# Patient Record
Sex: Female | Born: 1955 | Smoking: Never smoker
Health system: Southern US, Community
[De-identification: ages and names within clinical notes are randomized; demographics above are authoritative.]

## PROBLEM LIST (undated history)

## (undated) DIAGNOSIS — I10 Essential (primary) hypertension: Secondary | ICD-10-CM

## (undated) DIAGNOSIS — E139 Other specified diabetes mellitus without complications: Secondary | ICD-10-CM

## (undated) DIAGNOSIS — E119 Type 2 diabetes mellitus without complications: Secondary | ICD-10-CM

## (undated) HISTORY — DX: Type 2 diabetes mellitus without complications: E11.9

## (undated) HISTORY — DX: Other specified diabetes mellitus without complications: E13.9

## (undated) HISTORY — DX: Essential (primary) hypertension: I10

---

## 2016-09-14 ENCOUNTER — Other Ambulatory Visit: Payer: Self-pay | Admitting: Internal Medicine

## 2016-09-14 DIAGNOSIS — N289 Disorder of kidney and ureter, unspecified: Secondary | ICD-10-CM

## 2016-09-18 ENCOUNTER — Other Ambulatory Visit: Payer: PRIVATE HEALTH INSURANCE

## 2016-09-20 ENCOUNTER — Ambulatory Visit
Admission: RE | Admit: 2016-09-20 | Discharge: 2016-09-20 | Disposition: A | Discharge status: Home / Self Care | Payer: PRIVATE HEALTH INSURANCE | Source: Ambulatory Visit | Attending: Internal Medicine | Admitting: Internal Medicine

## 2016-09-20 DIAGNOSIS — N289 Disorder of kidney and ureter, unspecified: Secondary | ICD-10-CM

## 2016-09-27 ENCOUNTER — Other Ambulatory Visit: Payer: Self-pay | Admitting: Internal Medicine

## 2016-09-27 ENCOUNTER — Ambulatory Visit
Admission: RE | Admit: 2016-09-27 | Discharge: 2016-09-27 | Disposition: A | Discharge status: Home / Self Care | Payer: PRIVATE HEALTH INSURANCE | Source: Ambulatory Visit | Attending: Internal Medicine | Admitting: Internal Medicine

## 2016-09-27 DIAGNOSIS — R05 Cough: Secondary | ICD-10-CM

## 2016-09-27 DIAGNOSIS — R059 Cough, unspecified: Secondary | ICD-10-CM

## 2016-10-16 ENCOUNTER — Encounter: Payer: PRIVATE HEALTH INSURANCE | Attending: Internal Medicine | Admitting: Dietician

## 2016-10-16 DIAGNOSIS — E118 Type 2 diabetes mellitus with unspecified complications: Secondary | ICD-10-CM

## 2016-10-16 DIAGNOSIS — Z713 Dietary counseling and surveillance: Secondary | ICD-10-CM | POA: Diagnosis not present

## 2016-10-16 DIAGNOSIS — E119 Type 2 diabetes mellitus without complications: Secondary | ICD-10-CM | POA: Insufficient documentation

## 2016-10-16 DIAGNOSIS — E1165 Type 2 diabetes mellitus with hyperglycemia: Secondary | ICD-10-CM

## 2016-10-16 DIAGNOSIS — IMO0002 Reserved for concepts with insufficient information to code with codable children: Secondary | ICD-10-CM

## 2016-10-19 ENCOUNTER — Encounter: Payer: Self-pay | Admitting: Dietician

## 2016-10-19 NOTE — Progress Notes (Signed)

## 2016-10-23 ENCOUNTER — Encounter: Payer: PRIVATE HEALTH INSURANCE | Admitting: Dietician

## 2016-10-23 DIAGNOSIS — Z713 Dietary counseling and surveillance: Secondary | ICD-10-CM | POA: Diagnosis not present

## 2016-10-23 DIAGNOSIS — E119 Type 2 diabetes mellitus without complications: Secondary | ICD-10-CM

## 2016-10-23 NOTE — Progress Notes (Signed)

## 2016-10-30 ENCOUNTER — Encounter: Payer: Self-pay | Admitting: Skilled Nursing Facility1

## 2016-10-30 ENCOUNTER — Encounter: Payer: PRIVATE HEALTH INSURANCE | Attending: Internal Medicine | Admitting: Skilled Nursing Facility1

## 2016-10-30 DIAGNOSIS — E119 Type 2 diabetes mellitus without complications: Secondary | ICD-10-CM | POA: Diagnosis not present

## 2016-10-30 DIAGNOSIS — Z713 Dietary counseling and surveillance: Secondary | ICD-10-CM | POA: Insufficient documentation

## 2016-10-30 NOTE — Progress Notes (Signed)
Patient was seen on 10/30/2016 for the third of a series of three diabetes self-management courses at the [facility]. The following learning objectives were met by the patient during this class:  . State the amount of activity recommended for healthy living . Describe activities suitable for individual needs . Identify ways to regularly incorporate activity into daily life . Identify barriers to activity and ways to over come these barriers  Identify diabetes medications being personally used and their primary action for lowering glucose and possible side effects . Describe role of stress on blood glucose and develop strategies to address psychosocial issues . Identify diabetes complications and ways to prevent them  Explain how to manage diabetes during illness . Evaluate success in meeting personal goal . Establish 2-3 goals that they will plan to diligently work on until they return for the  60-monthfollow-up visit  Goals:   I will count my carb choices at most meals and snacks  I will be active 45 minutes or more 7 times a week  Your patient has identified their diabetes self-care support plan as  Family Support Plan:  Attend Monthly Diabetes Support Group as needed or make a future follow up appointment

## 2017-04-05 ENCOUNTER — Other Ambulatory Visit: Payer: Self-pay | Admitting: Obstetrics and Gynecology

## 2017-04-05 ENCOUNTER — Other Ambulatory Visit (HOSPITAL_COMMUNITY)
Admission: RE | Admit: 2017-04-05 | Discharge: 2017-04-05 | Disposition: A | Discharge status: Home / Self Care | Payer: PRIVATE HEALTH INSURANCE | Source: Ambulatory Visit | Attending: Obstetrics and Gynecology | Admitting: Obstetrics and Gynecology

## 2017-04-05 DIAGNOSIS — Z124 Encounter for screening for malignant neoplasm of cervix: Secondary | ICD-10-CM | POA: Diagnosis not present

## 2017-04-10 LAB — CYTOLOGY - PAP
DIAGNOSIS: NEGATIVE
HPV: NOT DETECTED

## 2017-05-07 IMAGING — CR DG CHEST 2V
2 series · 2 of 2 positions shown · non-contrast
Comparison: None.

CLINICAL DATA: Productive cough and chest congestion x 1 weeks, non
smoker, stat reading, pt to return to office w results per order

EXAM:
CHEST  2 VIEW

[w chest pa]
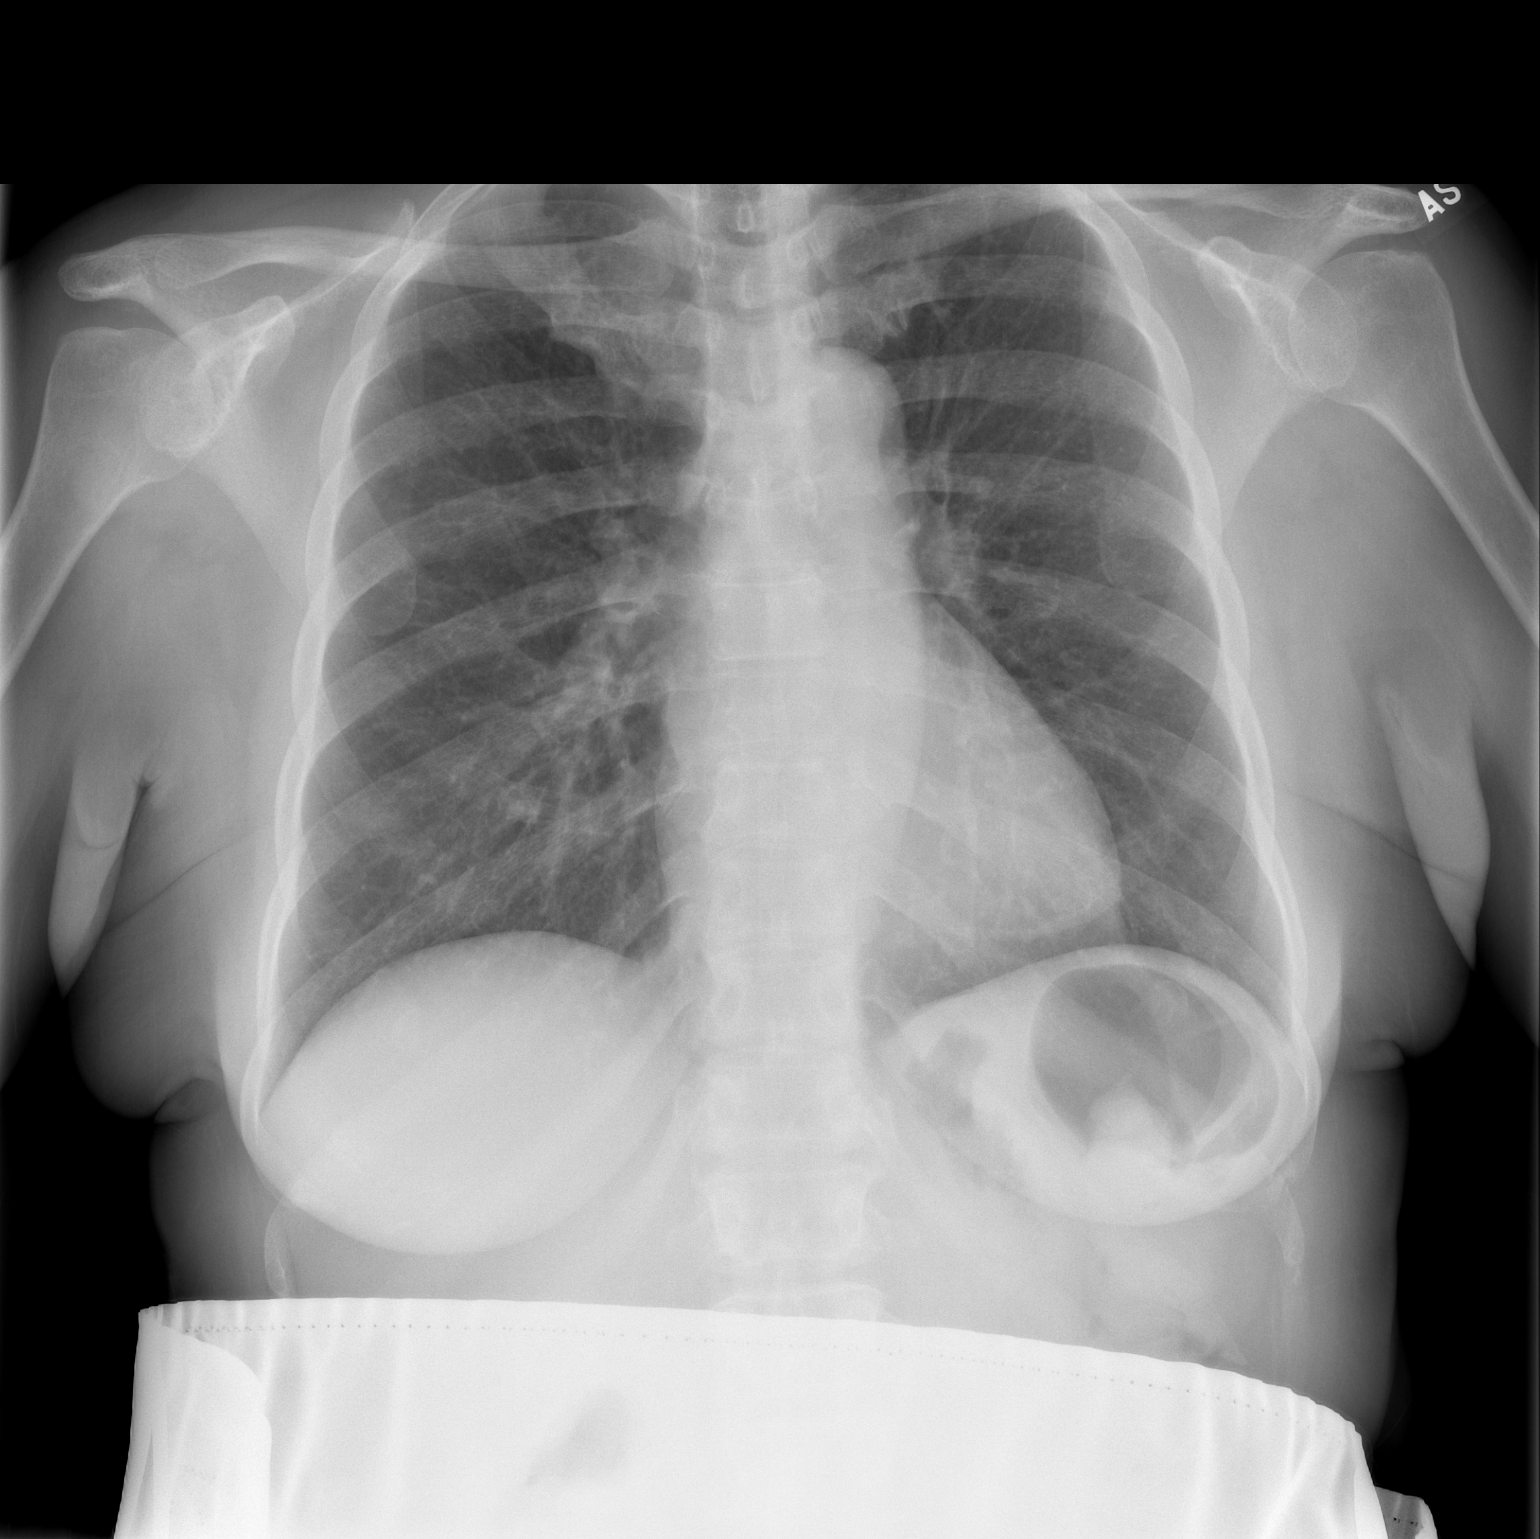

[w chest lat]
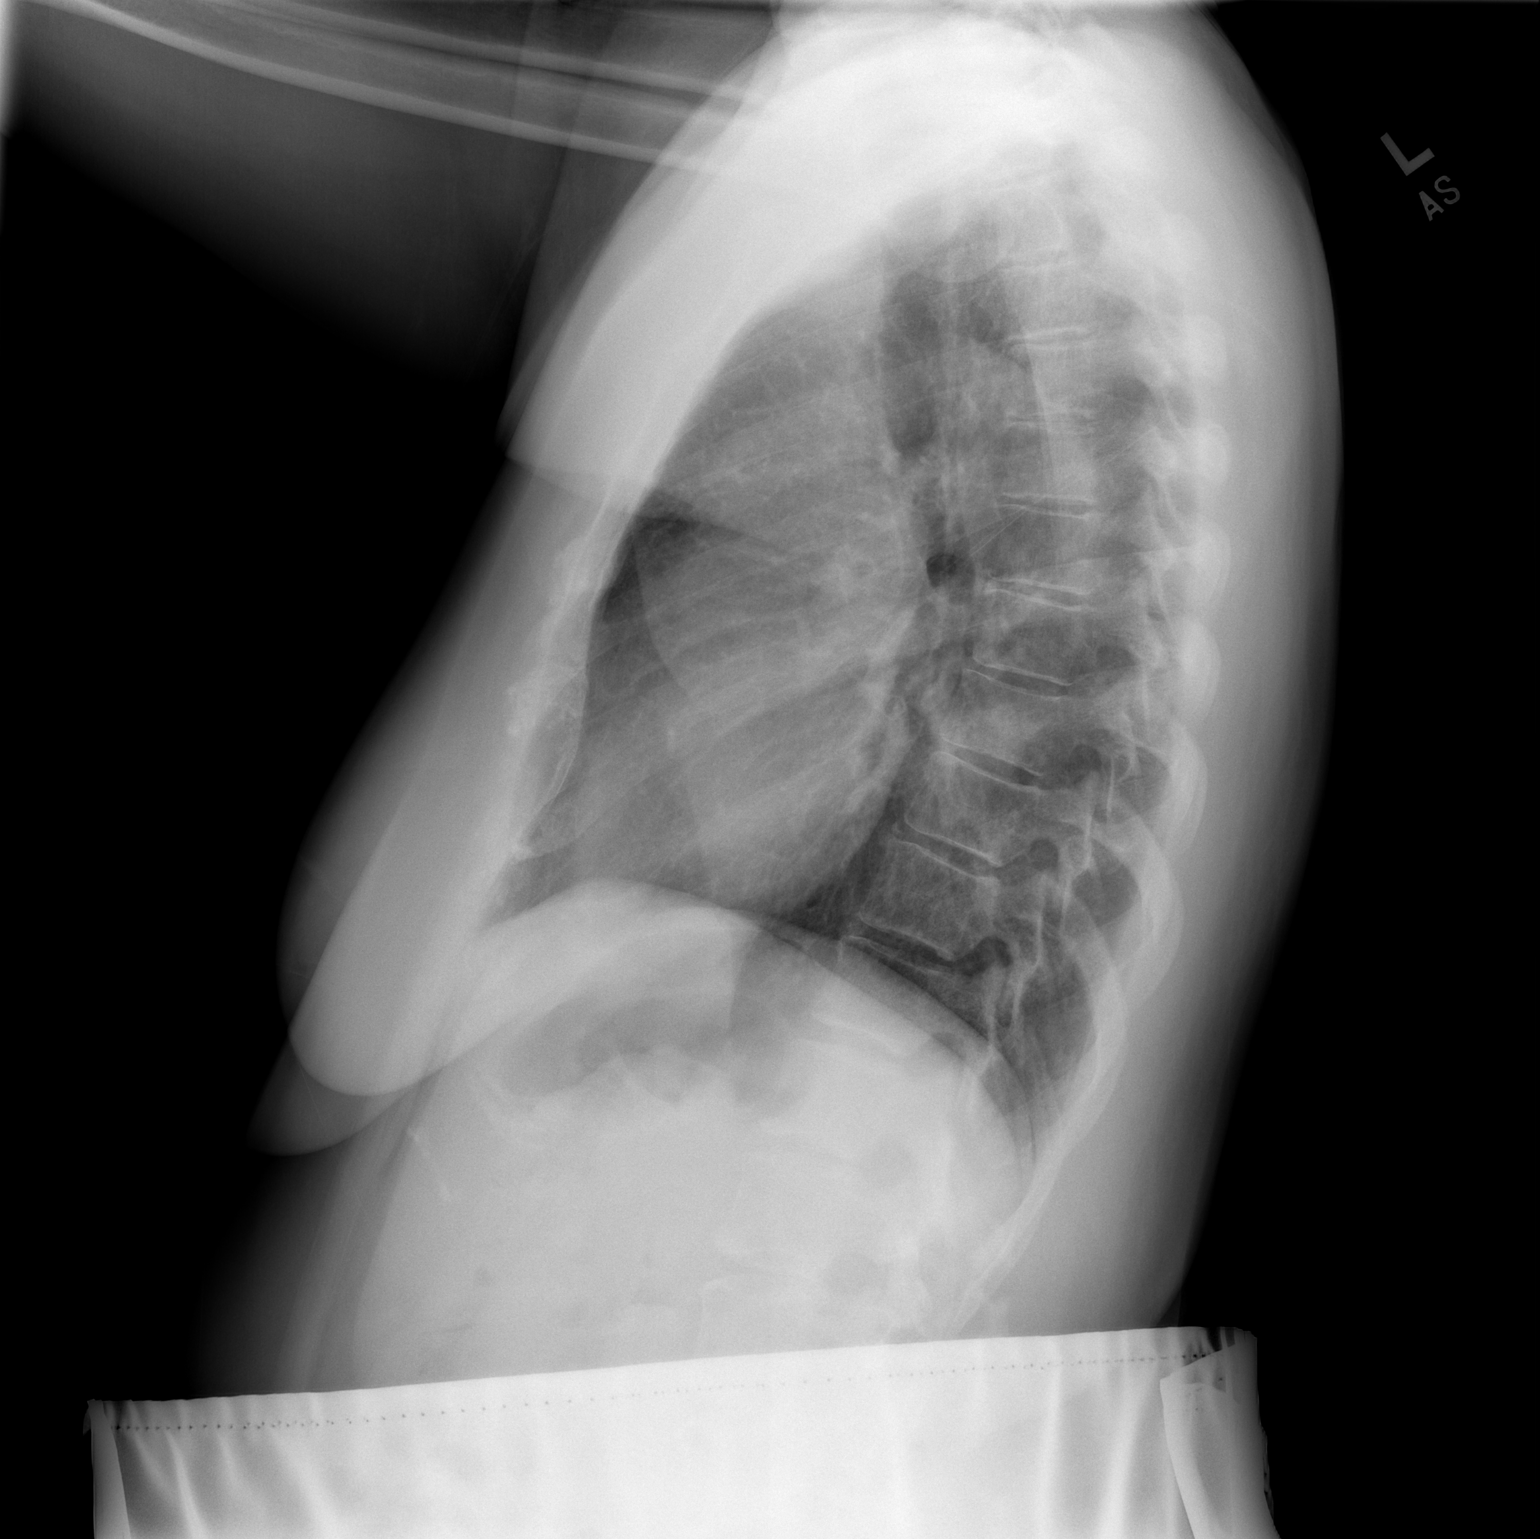

[2 of 2 positions shown; findings below may reference images not displayed]

FINDINGS: The heart size and mediastinal contours are within normal limits.
Both lungs are clear. The visualized skeletal structures are
unremarkable.
IMPRESSION: No active cardiopulmonary disease.

## 2018-04-30 ENCOUNTER — Encounter (INDEPENDENT_AMBULATORY_CARE_PROVIDER_SITE_OTHER): Payer: Self-pay | Admitting: Ophthalmology

## 2018-05-02 NOTE — Progress Notes (Signed)
[facility] - Clinic Note  05/05/2018     CHIEF COMPLAINT Patient presents for Retina Evaluation   HISTORY OF PRESENT ILLNESS: Catherine Boyd is a 62 y.o. female who presents to the clinic today for:   HPI    Retina Evaluation    In both eyes.  This started 1 week ago.  Associated Symptoms Negative for Flashes, Blind Spot, Photophobia, Scalp Tenderness, Fever, Weight Loss, Jaw Claudication, Glare, Pain, Floaters, Distortion, Redness, Trauma, Shoulder/Hip pain and Fatigue.  Context:  distance vision, mid-range vision and near vision.  Treatments tried include no treatments.  I, the attending physician,  performed the HPI with the patient and updated documentation appropriately.          Comments    Referral Of DR. [name] for retina eval. Patient states she has not noticed visual changes ou, she is DM2 x 2 yrs. Pt does not routinely monitor her BS, but states BS are WINL per her PCP, BS controlled by glimepiride.  Denies vit's/gtt's       Last edited by Rennis ChrisZamora, Andilynn Delavega, MD on 05/05/2018  2:04 PM. (History)    Pt states she went to see Dr. Katrinka BlazingSmith for yearly routine exam; Pt states she is taking metformin for DM; Pt states she is taking losartan for HBP; Pt states HBP is "always a little elevated when I got to the doctor"; Pt states she found out she had HBP when she was dx with DM; Pt states she does not know how high BP has ever gotten; Pt denies checking CBG or BP at home;   Referring physician: [name], Keisha, OD [address] [facility] [place], [place] [id number]  HISTORICAL INFORMATION:   Selected notes from the MEDICAL RECORD NUMBER Referred by Dr. Illene LabradorKeisha Smith for DM exam LEE: Kevin Fenton([name]) [BCVA: OD: OS:] Ocular Hx- PMH-DM (taking Metformin), HTN    CURRENT MEDICATIONS: No current outpatient medications on file. (Ophthalmic Drugs)   No current facility-administered medications for this visit.  (Ophthalmic Drugs)   Current Outpatient Medications (Other)   Medication Sig  . atorvastatin (LIPITOR) 10 MG tablet Take 10 mg by mouth daily.  Marland Kitchen. glimepiride (AMARYL) 2 MG tablet Take 2 mg by mouth daily with breakfast.  . lisinopril-hydrochlorothiazide (PRINZIDE,ZESTORETIC) 10-12.5 MG tablet Take 1 tablet by mouth daily.  Marland Kitchen. losartan (COZAAR) 50 MG tablet Take 50 mg by mouth daily.  . metFORMIN (GLUCOPHAGE) 500 MG tablet Take by mouth 2 (two) times daily with a meal.   Current Facility-Administered Medications (Other)  Medication Route  . Bevacizumab (AVASTIN) SOLN 1.25 mg Intravitreal      REVIEW OF SYSTEMS: ROS    Positive for: Endocrine, Eyes   Negative for: Constitutional, Gastrointestinal, Neurological, Skin, Genitourinary, Musculoskeletal, HENT, Cardiovascular, Respiratory, Psychiatric, Allergic/Imm, Heme/Lymph   Last edited by [name], [name], LPN on 2/13/08658/08/2018  1:23 PM. (History)       ALLERGIES Allergies  Allergen Reactions  . Penicillins     PAST MEDICAL HISTORY Past Medical History:  Diagnosis Date  . Diabetes 1.5, managed as type 2 (HCC)   . Diabetes mellitus without complication (HCC)   . Hypertension    History reviewed. No pertinent surgical history.  FAMILY HISTORY Family History  Problem Relation Age of Onset  . Stroke Mother   . Diabetes Paternal Uncle     SOCIAL HISTORY Social History   Tobacco Use  . Smoking status: Never Smoker  . Smokeless tobacco: Never Used  Substance Use Topics  .  Alcohol use: Yes    Alcohol/week: 1.0 standard drinks    Types: 1 Glasses of wine per week    Frequency: Never  . Drug use: Never         OPHTHALMIC EXAM:  Base Eye Exam    Visual Acuity (Snellen - Linear)      Right Left   Dist cc 20/30 20/25   Dist ph cc NI 20/20   Correction:  Glasses       Tonometry (Tonopen, 1:35 PM)      Right Left   Pressure 17 15       Pupils      Dark Light Shape React APD   Right 4 3 Round Brisk None   Left 4 3 Round Brisk None       Visual Fields (Counting fingers)       Left Right    Full Full       Extraocular Movement      Right Left    Ortho Ortho    -- -- --  --  --  -- -- --   -- -- --  --  --  -- -- --         Neuro/Psych    Oriented x3:  Yes   Mood/Affect:  Normal       Dilation    Both eyes:  phenylephrine hydroch @ 1:35 PM        Slit Lamp and Fundus Exam    Slit Lamp Exam      Right Left   Lids/Lashes Dermatochalasis - upper lid Dermatochalasis - upper lid   Conjunctiva/Sclera White and quiet White and quiet   Cornea Arcus, Inferior 2+ Punctate epithelial erosions, irregular epi Arcus, Inferior 2+ Punctate epithelial erosions, irregular epi   Anterior Chamber Deep and quiet Deep and quiet   Iris Round and dilated, No NVI Round and dilated, No NVI   Lens 2+ Nuclear sclerosis, 2-3+ Cortical cataract 2+ Nuclear sclerosis, 2-3+ Cortical cataract   Vitreous Vitreous syneresis Vitreous syneresis, Posterior vitreous detachment       Fundus Exam      Right Left   Disc Edema of superior hemisphere, CWS and hemorrhages along superior rim Pink and Sharp   C/D Ratio 0.5 0.5   Macula dot hemes, blot hemes, edema along ST arcades Good foveal reflex, Retinal pigment epithelial mottling, No heme or edema   Vessels Tortuous, Dilation greatest superiorly, Vascular attenuation, sclerotic arterioles  Mld Vascular attenuation, mildly Tortuous   Periphery Attached, scattered dot and blot hemorrhages superior hemisphere, scattered peripheral drusen Attached, scattered peripheral drusen        Refraction    Wearing Rx      Sphere Cylinder Axis Add   Right +4.25 +0.25 175 +2.50   Left +3.75 Sphere  +2.50       Manifest Refraction      Sphere Cylinder Axis Dist VA   Right +4.75 +0.25 180 20/30   Left +3.25 +0.50 015 20/20-2          IMAGING AND PROCEDURES  Imaging and Procedures for @TODAY @  OCT, Retina - OU - Both Eyes       Right Eye Quality was good. Central Foveal Thickness: 257. Progression has no prior data.  Findings include abnormal foveal contour, intraretinal fluid, no SRF, inner retinal atrophy, intraretinal hyper-reflective material (Inner retinal atrophy superiorly, +IRF superior hemisphere greatest along proximal ST arcade).   Left Eye Quality was good. Central Foveal Thickness: 260.  Progression has no prior data. Findings include normal foveal contour, no IRF, no SRF.   Notes *Images captured and stored on drive  Diagnosis / Impression:  OD: +IRF along sup temp arcades -- greatest proximal arcades; inner-retinal hyperreflectivity and diffuse atrophy           Suspect combined HRVO/HRAO OS: NFP, No IRF/SRF  Clinical management:  See below  Abbreviations: NFP - Normal foveal profile. CME - cystoid macular edema. PED - pigment epithelial detachment. IRF - intraretinal fluid. SRF - subretinal fluid. EZ - ellipsoid zone. ERM - epiretinal membrane. ORA - outer retinal atrophy. ORT - outer retinal tubulation. SRHM - subretinal hyper-reflective material         Fluorescein Angiography Optos (Transit OD)       Right Eye   Progression has no prior data. Early phase findings include vascular perfusion defect, delayed filling (Veinous return not visible until and 36sec;). Mid/Late phase findings include leakage, vascular perfusion defect, staining (Leakage from superior veins ).   Left Eye   Progression has no prior data. Early phase findings include normal observations (Low signal). Mid/Late phase findings include normal observations.   Notes Images captured and stored on drive;   Impression: OD: combined BRAO / BRVO, superior hemisphere OS: normal study          Intravitreal Injection, Pharmacologic Agent - OD - Right Eye       Time Out 05/05/2018. 3:16 PM. Confirmed correct patient, procedure, site, and patient consented.   Anesthesia Topical anesthesia was used. Anesthetic medications included Lidocaine 2%, Tetracaine 0.5%.   Procedure Preparation included 5%  betadine to ocular surface, eyelid speculum. A supplied needle was used.   Injection:  1.25 mg Bevacizumab 1.25mg /0.52ml   NDC: 16109-604-54, Lot: [id number] , Expiration date: 05/28/2018   Route: Intravitreal, Site: Right Eye, Waste: 0 mg  Post-op Post injection exam found visual acuity of at least counting fingers. The patient tolerated the procedure well. There were no complications. The patient received written and verbal post procedure care education.                 ASSESSMENT/PLAN:    ICD-10-CM   1. Branch retinal vein occlusion of right eye with macular edema H34.8310 Fluorescein Angiography Optos (Transit OD)    Intravitreal Injection, Pharmacologic Agent - OD - Right Eye    Bevacizumab (AVASTIN) SOLN 1.25 mg  2. BRAO (branch retinal artery occlusion), right H34.231 Fluorescein Angiography Optos (Transit OD)  3. Retinal edema H35.81 OCT, Retina - OU - Both Eyes  4. Essential hypertension I10   5. Hypertensive retinopathy of both eyes H35.033 Fluorescein Angiography Optos (Transit OD)  6. Diabetes mellitus type 2 without retinopathy (HCC) E11.9   7. Pseudophakia of both eyes Z96.1   8. Dry eyes H04.123     1-3. Combined HRVO/HRAO with macular edema OD-   - The natural history of retinal vein occlusion and macular edema and treatment options including observation, laser photocoagulation, and intravitreal antiVEGF injection with Avastin and Lucentis and Eylea and intravitreal injection of steroids with triamcinolone and Ozurdex and the complications of these procedures including loss of vision, infection, cataract, glaucoma, and retinal detachment were discussed with patient. - Specifically discussed findings from CRUISE / BRAVO study regarding patient stabilization with anti-VEGF agents and increased potential for visual improvements.  Also discussed need for frequent follow up and potentially multiple injections given the chronic nature of the disease process - BCVA 20/30  OD - exam shows sclerotic arterioles, dilated  tortuous venules, scattered IRH and CWS throughout superior hemisphere + intraretinal fluid / edema - OCT shows atrophy with hyperreflective inner retina and CME along sup temp arcades - FA shows severely delayed filling, massive vascular perfusion defects throughout superior hemisphere and late vascular leakage - discussed findings, prognosis and treatment options - recommend IVA OD #1 today, 08.12.19 - RBA of procedure discussed, questions answered - informed consent obtained and signed - see procedure note - recommend vascular work up including carotid dopplers, EKG, echocardiogram, CBC, CMP, possible hypercoag work up -- will send message to PCP, Dr. Nehemiah Settle - F/U 4 weeks -- DFE/OCT/possible injection  4,5. Hypertensive retinopathy OU - discussed importance of tight BP control and likely contribution to above pathologies - monitor  6. Diabetes mellitus, type 2 without retinopathy - The incidence, risk factors for progression, natural history and treatment options for diabetic retinopathy  were discussed with patient.   - The need for close monitoring of blood glucose, blood pressure, and serum lipids, avoiding cigarette or any type of tobacco, and the need for long term follow up was also discussed with patient. - f/u in 1 year, sooner prn  7. 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 - discussed diagnosis and progression - not yet visually significant - monitor for now  8. Dry eyes OU - recommend artificial tears and lubricating ointment as needed    Ophthalmic Meds Ordered this visit:  Meds ordered this encounter  Medications  . Bevacizumab (AVASTIN) SOLN 1.25 mg       Return in about 4 weeks (around 06/02/2018) for F/U HRVO OD, DFE, OCT.  There are no Patient Instructions on file for this visit.   Explained the diagnoses, plan,  and follow up with the patient and they expressed understanding.  Patient expressed understanding of the importance of proper follow up care.   This document serves as a record of services personally performed by Karie Chimera, MD, PhD. It was created on their behalf by Laurian Brim, OA, an ophthalmic assistant. The creation of this record is the provider's dictation and/or activities during the visit.    Electronically signed by: Laurian Brim, OA  08.09.2019 11:53 PM   This document serves as a record of services personally performed by Karie Chimera, MD, PhD. It was created on their behalf by Virgilio Belling, COA, a certified ophthalmic assistant. The creation of this record is the provider's dictation and/or activities during the visit.  Electronically signed by: Virgilio Belling, COA  08.12.19 11:53 PM   Karie Chimera, M.D., Ph.D. Diseases & Surgery of the Retina and Vitreous [facility]  I have reviewed the above documentation for accuracy and completeness, and I agree with the above. Karie Chimera, M.D., Ph.D. 05/05/18 11:56 PM     Abbreviations: M myopia (nearsighted); A astigmatism; H hyperopia (farsighted); P presbyopia; Mrx spectacle prescription;  CTL contact lenses; OD right eye; OS left eye; OU both eyes  XT exotropia; ET esotropia; PEK punctate epithelial keratitis; PEE punctate epithelial erosions; DES dry eye syndrome; MGD meibomian gland dysfunction; ATs artificial tears; PFAT's preservative free artificial tears; NSC nuclear sclerotic cataract; PSC posterior subcapsular cataract; ERM epi-retinal membrane; PVD posterior vitreous detachment; RD retinal detachment; DM diabetes mellitus; DR diabetic retinopathy; NPDR non-proliferative diabetic retinopathy; PDR proliferative diabetic retinopathy; CSME clinically significant macular edema; DME diabetic macular edema; dbh dot blot hemorrhages; CWS cotton wool spot;  POAG primary open angle glaucoma; C/D  cup-to-disc ratio; HVF humphrey visual field; GVF goldmann visual field; OCT optical coherence tomography; IOP intraocular pressure; BRVO Branch retinal vein occlusion; CRVO central retinal vein occlusion; CRAO central retinal artery occlusion; BRAO branch retinal artery occlusion; RT retinal tear; SB scleral buckle; PPV pars plana vitrectomy; VH Vitreous hemorrhage; PRP panretinal laser photocoagulation; IVK intravitreal kenalog; VMT vitreomacular traction; MH Macular hole;  NVD neovascularization of the disc; NVE neovascularization elsewhere; AREDS age related eye disease study; ARMD age related macular degeneration; POAG primary open angle glaucoma; EBMD epithelial/anterior basement membrane dystrophy; ACIOL anterior chamber intraocular lens; IOL intraocular lens; PCIOL posterior chamber intraocular lens; Phaco/IOL phacoemulsification with intraocular lens placement; PRK photorefractive keratectomy; LASIK laser assisted in situ keratomileusis; HTN hypertension; DM diabetes mellitus; COPD chronic obstructive pulmonary disease

## 2018-05-05 ENCOUNTER — Encounter (INDEPENDENT_AMBULATORY_CARE_PROVIDER_SITE_OTHER): Payer: Self-pay | Admitting: Ophthalmology

## 2018-05-05 ENCOUNTER — Ambulatory Visit (INDEPENDENT_AMBULATORY_CARE_PROVIDER_SITE_OTHER): Payer: BC Managed Care – PPO | Admitting: Ophthalmology

## 2018-05-05 DIAGNOSIS — H34231 Retinal artery branch occlusion, right eye: Secondary | ICD-10-CM | POA: Diagnosis not present

## 2018-05-05 DIAGNOSIS — H3581 Retinal edema: Secondary | ICD-10-CM

## 2018-05-05 DIAGNOSIS — I10 Essential (primary) hypertension: Secondary | ICD-10-CM | POA: Diagnosis not present

## 2018-05-05 DIAGNOSIS — H35033 Hypertensive retinopathy, bilateral: Secondary | ICD-10-CM

## 2018-05-05 DIAGNOSIS — E119 Type 2 diabetes mellitus without complications: Secondary | ICD-10-CM

## 2018-05-05 DIAGNOSIS — H04123 Dry eye syndrome of bilateral lacrimal glands: Secondary | ICD-10-CM

## 2018-05-05 DIAGNOSIS — H34831 Tributary (branch) retinal vein occlusion, right eye, with macular edema: Secondary | ICD-10-CM

## 2018-05-05 DIAGNOSIS — H25813 Combined forms of age-related cataract, bilateral: Secondary | ICD-10-CM

## 2018-05-05 MED ORDER — BEVACIZUMAB CHEMO INJECTION 1.25MG/0.05ML SYRINGE FOR KALEIDOSCOPE
1.2500 mg | INTRAVITREAL | Status: DC
  Administered 2018-05-05: 1.25 mg via INTRAVITREAL

## 2018-06-02 NOTE — Progress Notes (Signed)
[facility] - Clinic Note  06/03/2018     CHIEF COMPLAINT Patient presents for Retina Follow Up   HISTORY OF PRESENT ILLNESS: Catherine Boyd is a 62 y.o. female who presents to the clinic today for:   HPI    Retina Follow Up    Patient presents with  Other.  In right eye.  Severity is mild.  Duration of 1 month.  Since onset it is stable.  I, the attending physician,  performed the HPI with the patient and updated documentation appropriately.          Comments    F/U HRVO OD. Patient states she has" become more aware of her vision in her right eye", denies visual changes,flashes,floaters and ocular pain. Pt reports she has received new Rx glasses. BS is not monitor daily, denies hypo/hyperglucemic episodes. Pt is ready for tx today if indicted.       Last edited by Rennis Chris, MD on 06/04/2018 11:29 PM. (History)    Pt states she has not gotten a call or been in touch with PCP since being seen last;   Referring physician: Renford Dills, MD [address] [place], [place] [address]  HISTORICAL INFORMATION:   Selected notes from the MEDICAL RECORD NUMBER Referred by Dr. Illene Labrador for DM exam LEE: Kevin Fenton) [BCVA: OD: OS:] Ocular Hx- PMH-DM (taking Metformin), HTN    CURRENT MEDICATIONS: No current outpatient medications on file. (Ophthalmic Drugs)   No current facility-administered medications for this visit.  (Ophthalmic Drugs)   Current Outpatient Medications (Other)  Medication Sig  . atorvastatin (LIPITOR) 10 MG tablet Take 10 mg by mouth daily.  Marland Kitchen glimepiride (AMARYL) 2 MG tablet Take 2 mg by mouth daily with breakfast.  . lisinopril-hydrochlorothiazide (PRINZIDE,ZESTORETIC) 10-12.5 MG tablet Take 1 tablet by mouth daily.  Marland Kitchen losartan (COZAAR) 50 MG tablet Take 50 mg by mouth daily.  . metFORMIN (GLUCOPHAGE) 500 MG tablet Take by mouth 2 (two) times daily with a meal.   Current Facility-Administered Medications (Other)   Medication Route  . Bevacizumab (AVASTIN) SOLN 1.25 mg Intravitreal  . Bevacizumab (AVASTIN) SOLN 1.25 mg Intravitreal      REVIEW OF SYSTEMS: ROS    Positive for: Eyes   Negative for: Constitutional, Gastrointestinal, Neurological, Skin, Genitourinary, Musculoskeletal, HENT, Endocrine, Cardiovascular, Respiratory, Psychiatric, Allergic/Imm, Heme/Lymph   Last edited by Eldridge Scot, LPN on 02/26/5408 12:43 PM. (History)       ALLERGIES Allergies  Allergen Reactions  . Penicillins     PAST MEDICAL HISTORY Past Medical History:  Diagnosis Date  . Diabetes 1.5, managed as type 2 (HCC)   . Diabetes mellitus without complication (HCC)   . Hypertension    History reviewed. No pertinent surgical history.  FAMILY HISTORY Family History  Problem Relation Age of Onset  . Stroke Mother   . Diabetes Paternal Uncle     SOCIAL HISTORY Social History   Tobacco Use  . Smoking status: Never Smoker  . Smokeless tobacco: Never Used  Substance Use Topics  . Alcohol use: Yes    Alcohol/week: 1.0 standard drinks    Types: 1 Glasses of wine per week    Frequency: Never  . Drug use: Never         OPHTHALMIC EXAM:  Base Eye Exam    Visual Acuity (Snellen - Linear)      Right Left   Dist cc 20/40 20/20   Dist ph cc 20/30 NI  Correction:  Glasses       Tonometry (Tonopen, 1:00 PM)      Right Left   Pressure 15 15       Pupils      Dark Light Shape React APD   Right 4 3 Round Brisk None   Left 4 3 Round Brisk None       Visual Fields (Counting fingers)      Left Right    Full Full       Extraocular Movement      Right Left    Full, Ortho Full, Ortho       Neuro/Psych    Oriented x3:  Yes   Mood/Affect:  Normal       Dilation    Both eyes:  1.0% Mydriacyl, 2.5% Phenylephrine @ 1:00 PM        Slit Lamp and Fundus Exam    Slit Lamp Exam      Right Left   Lids/Lashes Dermatochalasis - upper lid Dermatochalasis - upper lid   Conjunctiva/Sclera  White and quiet White and quiet   Cornea Arcus, Inferior 2+ Punctate epithelial erosions, irregular epi Arcus, Inferior 2+ Punctate epithelial erosions, irregular epi   Anterior Chamber Deep and quiet Deep and quiet   Iris Round and dilated, No NVI Round and dilated, No NVI   Lens 2+ Nuclear sclerosis, 2-3+ Cortical cataract 2+ Nuclear sclerosis, 2-3+ Cortical cataract   Vitreous Vitreous syneresis Vitreous syneresis, Posterior vitreous detachment       Fundus Exam      Right Left   Disc Edema of superior hemisphere, CWS and hemorrhages along superior rim -- all improved Pink and Sharp   C/D Ratio 0.5 0.5   Macula dot hemes, blot hemes, edema along ST arcades, CWS along ST arcades Good foveal reflex, Retinal pigment epithelial mottling, No heme or edema   Vessels Tortuous, Dilation greatest superiorly - less dilated today, Vascular attenuation, sclerotic arterioles with mild improvement Mld Vascular attenuation, mildly Tortuous   Periphery Attached, scattered dot and blot hemorrhages superior hemisphere, scattered peripheral drusen Attached, scattered peripheral drusen          IMAGING AND PROCEDURES  Imaging and Procedures for @TODAY @  OCT, Retina - OU - Both Eyes       Right Eye Quality was good. Central Foveal Thickness: 245. Progression has improved. Findings include abnormal foveal contour, no SRF, inner retinal atrophy, intraretinal hyper-reflective material, no IRF (Inner retinal atrophy superiorly -- stable, interval improvement in IRF superior hemisphere greatest along proximal ST arcade).   Left Eye Quality was good. Central Foveal Thickness: 262. Progression has been stable. Findings include normal foveal contour, no IRF, no SRF.   Notes *Images captured and stored on drive  Diagnosis / Impression:  OD: interval improvement in IRF along sup temp arcades; inner-retinal hyperreflectivity deceased and diffuse atrophy -combined HRVO/HRAO improving OS: NFP, No  IRF/SRF  Clinical management:  See below  Abbreviations: NFP - Normal foveal profile. CME - cystoid macular edema. PED - pigment epithelial detachment. IRF - intraretinal fluid. SRF - subretinal fluid. EZ - ellipsoid zone. ERM - epiretinal membrane. ORA - outer retinal atrophy. ORT - outer retinal tubulation. SRHM - subretinal hyper-reflective material         Intravitreal Injection, Pharmacologic Agent - OD - Right Eye       Time Out 06/03/2018. 1:49 PM. Confirmed correct patient, procedure, site, and patient consented.   Anesthesia Topical anesthesia was used. Anesthetic medications included Lidocaine 2%,  Tetracaine 0.5%.   Procedure Preparation included 5% betadine to ocular surface, eyelid speculum. A 30 gauge needle was used.   Injection:  1.25 mg Bevacizumab 1.25mg /0.53ml   NDC: 67209-470-96, Lot: [id number] , Expiration date: 07/16/2018   Route: Intravitreal, Site: Right Eye, Waste: 0 mL  Post-op Post injection exam found visual acuity of at least counting fingers. The patient tolerated the procedure well. There were no complications. The patient received written and verbal post procedure care education.                 ASSESSMENT/PLAN:    ICD-10-CM   1. Branch retinal vein occlusion of right eye with macular edema H34.8310 OCT, Retina - OU - Both Eyes    Intravitreal Injection, Pharmacologic Agent - OD - Right Eye    Bevacizumab (AVASTIN) SOLN 1.25 mg  2. BRAO (branch retinal artery occlusion), right H34.231   3. Retinal edema H35.81 OCT, Retina - OU - Both Eyes  4. Essential hypertension I10   5. Hypertensive retinopathy of both eyes H35.033   6. Diabetes mellitus type 2 without retinopathy (HCC) E11.9   7. 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 Dry eyes H04.123     1-3. Combined HRVO/HRAO with macular edema OD-  - S/P IVA OD #1 (08.12.19) - BCVA remains 20/30 OD - exam shows sclerotic arterioles, dilated tortuous venules,  scattered IRH and CWS throughout superior hemisphere + intraretinal fluid / edema - OCT shows atrophy with improved hyperreflective inner retina and CME along sup temp arcades - initial FA 8.12.19 showed severely delayed filling, massive vascular perfusion defects throughout superior hemisphere and late vascular leakage - recommend IVA OD #2 today, (09.10.19) - RBA of procedure discussed, questions answered - informed consent obtained and signed - see procedure note - recommend vascular work up including carotid dopplers, EKG, echocardiogram, CBC, CMP, possible hypercoag work up -- will re-send message to PCP, Dr. 11.10.19 - F/U 4 weeks -- DFE/OCT/possible injection  4,5. Hypertensive retinopathy OU - discussed importance of tight BP control and likely contribution to above pathologies - monitor  6. Diabetes mellitus, type 2 without retinopathy - The incidence, risk factors for progression, natural history and treatment options for diabetic retinopathy  were discussed with patient.   - The need for close monitoring of blood glucose, blood pressure, and serum lipids, avoiding cigarette or any type of tobacco, and the need for long term follow up was also discussed with patient. - f/u in 1 year, sooner prn  7. 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 - discussed diagnosis and progression - not yet visually significant - monitor for now  8. Dry eyes OU - recommend artificial tears and lubricating ointment as needed    Ophthalmic Meds Ordered this visit:  Meds ordered this encounter  Medications  . Bevacizumab (AVASTIN) SOLN 1.25 mg       Return in about 4 weeks (around 07/01/2018) for f/u sup HRVO/HRAO - Dilated Exam, OCT, Possible Injxn.  There are no Patient Instructions on file for this visit.   Explained the diagnoses, plan, and follow up with the patient and they expressed  understanding.  Patient expressed understanding of the importance of proper follow up care.   This document serves as a record of services personally performed by 08/31/2018, MD, PhD. It was created on their behalf by Karie Chimera, OA, an ophthalmic assistant. The creation of  this record is the provider's dictation and/or activities during the visit.    Electronically signed by: Laurian Brim, OA  09.09.19 11:35 PM   This document serves as a record of services personally performed by Karie Chimera, MD, PhD. It was created on their behalf by Virgilio Belling, COA, a certified ophthalmic assistant. The creation of this record is the provider's dictation and/or activities during the visit.  Electronically signed by: Virgilio Belling, COA  09.10.19 11:35 PM   Karie Chimera, M.D., Ph.D. Diseases & Surgery of the Retina and Vitreous [facility]   I have reviewed the above documentation for accuracy and completeness, and I agree with the above. Karie Chimera, M.D., Ph.D. 06/04/18 11:35 PM     Abbreviations: M myopia (nearsighted); A astigmatism; H hyperopia (farsighted); P presbyopia; Mrx spectacle prescription;  CTL contact lenses; OD right eye; OS left eye; OU both eyes  XT exotropia; ET esotropia; PEK punctate epithelial keratitis; PEE punctate epithelial erosions; DES dry eye syndrome; MGD meibomian gland dysfunction; ATs artificial tears; PFAT's preservative free artificial tears; NSC nuclear sclerotic cataract; PSC posterior subcapsular cataract; ERM epi-retinal membrane; PVD posterior vitreous detachment; RD retinal detachment; DM diabetes mellitus; DR diabetic retinopathy; NPDR non-proliferative diabetic retinopathy; PDR proliferative diabetic retinopathy; CSME clinically significant macular edema; DME diabetic macular edema; dbh dot blot hemorrhages; CWS cotton wool spot; POAG primary open angle glaucoma; C/D cup-to-disc ratio; HVF humphrey visual field; GVF  goldmann visual field; OCT optical coherence tomography; IOP intraocular pressure; BRVO Branch retinal vein occlusion; CRVO central retinal vein occlusion; CRAO central retinal artery occlusion; BRAO branch retinal artery occlusion; RT retinal tear; SB scleral buckle; PPV pars plana vitrectomy; VH Vitreous hemorrhage; PRP panretinal laser photocoagulation; IVK intravitreal kenalog; VMT vitreomacular traction; MH Macular hole;  NVD neovascularization of the disc; NVE neovascularization elsewhere; AREDS age related eye disease study; ARMD age related macular degeneration; POAG primary open angle glaucoma; EBMD epithelial/anterior basement membrane dystrophy; ACIOL anterior chamber intraocular lens; IOL intraocular lens; PCIOL posterior chamber intraocular lens; Phaco/IOL phacoemulsification with intraocular lens placement; PRK photorefractive keratectomy; LASIK laser assisted in situ keratomileusis; HTN hypertension; DM diabetes mellitus; COPD chronic obstructive pulmonary disease

## 2018-06-03 ENCOUNTER — Ambulatory Visit (INDEPENDENT_AMBULATORY_CARE_PROVIDER_SITE_OTHER): Payer: BC Managed Care – PPO | Admitting: Ophthalmology

## 2018-06-03 ENCOUNTER — Encounter (INDEPENDENT_AMBULATORY_CARE_PROVIDER_SITE_OTHER): Payer: Self-pay | Admitting: Ophthalmology

## 2018-06-03 DIAGNOSIS — H3581 Retinal edema: Secondary | ICD-10-CM

## 2018-06-03 DIAGNOSIS — H04123 Dry eye syndrome of bilateral lacrimal glands: Secondary | ICD-10-CM

## 2018-06-03 DIAGNOSIS — E119 Type 2 diabetes mellitus without complications: Secondary | ICD-10-CM

## 2018-06-03 DIAGNOSIS — H34831 Tributary (branch) retinal vein occlusion, right eye, with macular edema: Secondary | ICD-10-CM

## 2018-06-03 DIAGNOSIS — H25813 Combined forms of age-related cataract, bilateral: Secondary | ICD-10-CM

## 2018-06-03 DIAGNOSIS — H35033 Hypertensive retinopathy, bilateral: Secondary | ICD-10-CM

## 2018-06-03 DIAGNOSIS — I10 Essential (primary) hypertension: Secondary | ICD-10-CM

## 2018-06-03 DIAGNOSIS — H34231 Retinal artery branch occlusion, right eye: Secondary | ICD-10-CM

## 2018-06-04 DIAGNOSIS — H34831 Tributary (branch) retinal vein occlusion, right eye, with macular edema: Secondary | ICD-10-CM | POA: Diagnosis not present

## 2018-06-04 MED ORDER — BEVACIZUMAB CHEMO INJECTION 1.25MG/0.05ML SYRINGE FOR KALEIDOSCOPE
1.2500 mg | INTRAVITREAL | Status: DC
  Administered 2018-06-04: 1.25 mg via INTRAVITREAL

## 2018-07-07 NOTE — Progress Notes (Signed)
[facility] - Clinic Note  07/08/2018     CHIEF COMPLAINT Patient presents for Retina Follow Up   HISTORY OF PRESENT ILLNESS: Catherine Boyd is a 62 y.o. female who presents to the clinic today for:   HPI    Retina Follow Up    Patient presents with  CRVO/BRVO.  In right eye.  This started 1 month ago.  Severity is mild.  Since onset it is stable.  I, the attending physician,  performed the HPI with the patient and updated documentation appropriately.          Comments    F/U CRVO OD. Patient states her vision is still"about the same" denies visual onsets. Bs was checked last month ago ,pt reports is was [facility], .  Denies gtt's . Pt is ready for [place] if indicted.        Last edited by Rennis Chris, MD on 07/08/2018  3:41 PM. (History)      Referring physician: Renford Dills, MD [address] [place], [place] [address]  HISTORICAL INFORMATION:   Selected notes from the MEDICAL RECORD NUMBER Referred by Dr. Illene Labrador for DM exam LEE: Kevin Fenton) [BCVA: OD: OS:] Ocular Hx- PMH-DM (taking Metformin), HTN    CURRENT MEDICATIONS: No current outpatient medications on file. (Ophthalmic Drugs)   No current facility-administered medications for this visit.  (Ophthalmic Drugs)   Current Outpatient Medications (Other)  Medication Sig  . atorvastatin (LIPITOR) 10 MG tablet Take 10 mg by mouth daily.  Marland Kitchen glimepiride (AMARYL) 2 MG tablet Take 2 mg by mouth daily with breakfast.  . lisinopril-hydrochlorothiazide (PRINZIDE,ZESTORETIC) 10-12.5 MG tablet Take 1 tablet by mouth daily.  Marland Kitchen losartan (COZAAR) 50 MG tablet Take 50 mg by mouth daily.  . metFORMIN (GLUCOPHAGE) 500 MG tablet Take by mouth 2 (two) times daily with a meal.   Current Facility-Administered Medications (Other)  Medication Route  . Bevacizumab (AVASTIN) SOLN 1.25 mg Intravitreal  . Bevacizumab (AVASTIN) SOLN 1.25 mg Intravitreal  . Bevacizumab (AVASTIN) SOLN 1.25 mg  Intravitreal      REVIEW OF SYSTEMS: ROS    Positive for: Endocrine, Eyes   Negative for: Constitutional, Gastrointestinal, Neurological, Skin, Genitourinary, Musculoskeletal, HENT, Cardiovascular, Respiratory, Psychiatric, Allergic/Imm, Heme/Lymph   Last edited by Eldridge Scot, LPN on 60/45/4098  2:59 PM. (History)       ALLERGIES Allergies  Allergen Reactions  . Penicillins     PAST MEDICAL HISTORY Past Medical History:  Diagnosis Date  . Diabetes 1.5, managed as type 2 (HCC)   . Diabetes mellitus without complication (HCC)   . Hypertension    History reviewed. No pertinent surgical history.  FAMILY HISTORY Family History  Problem Relation Age of Onset  . Stroke Mother   . Diabetes Paternal Uncle     SOCIAL HISTORY Social History   Tobacco Use  . Smoking status: Never Smoker  . Smokeless tobacco: Never Used  Substance Use Topics  . Alcohol use: Yes    Alcohol/week: 1.0 standard drinks    Types: 1 Glasses of wine per week    Frequency: Never  . Drug use: Never         OPHTHALMIC EXAM:  Base Eye Exam    Visual Acuity (Snellen - Linear)      Right Left   Dist cc 20/40 20/20   Dist ph cc 20/30 NI   Correction:  Glasses       Tonometry (Tonopen, 2:27 PM)  Right Left   Pressure 17 14       Pupils      Dark Light Shape React APD   Right 3 2 Round Brisk None   Left 3 2 Round Brisk None       Visual Fields (Counting fingers)      Left Right    Full Full       Extraocular Movement      Right Left    Full, Ortho Full, Ortho       Neuro/Psych    Oriented x3:  Yes   Mood/Affect:  Normal       Dilation    Both eyes:  1.0% Mydriacyl, 2.5% Phenylephrine @ 2:26 PM        Slit Lamp and Fundus Exam    Slit Lamp Exam      Right Left   Lids/Lashes Dermatochalasis - upper lid Dermatochalasis - upper lid   Conjunctiva/Sclera White and quiet White and quiet   Cornea Arcus, Inferior 2+ Punctate epithelial erosions, irregular epi  Arcus, Inferior 2+ Punctate epithelial erosions, irregular epi   Anterior Chamber Deep and quiet Deep and quiet   Iris Round and dilated, No NVI Round and dilated, No NVI   Lens 2+ Nuclear sclerosis, 2-3+ Cortical cataract 2+ Nuclear sclerosis, 2-3+ Cortical cataract   Vitreous Vitreous syneresis Vitreous syneresis, Posterior vitreous detachment       Fundus Exam      Right Left   Disc Edema of superior hemisphere, CWS and hemorrhages along superior rim -- all improved, promintent cilioretinal arteries, mildly Tilted disc Pink and Sharp, mildly Tilted disc   C/D Ratio 0.5 0.5   Macula dot hemes, blot hemes, edema along ST arcades, CWS along ST arcades, scattered CWS superior macula Blunted foveal reflex, Retinal pigment epithelial mottling, No heme or edema   Vessels Tortuous, Dilation greatest superiorly - less dilated today, Vascular attenuation, sclerotic arterioles with mild improvement, +boxcarring, tortous venules with sheathing, combined HRVO/HRAO, severely sclerotic arterioles superior hemisphere Mld Vascular attenuation, Tortuous   Periphery Attached, scattered dot and blot hemorrhages superior hemisphere, scattered peripheral drusen Attached, scattered peripheral drusen          IMAGING AND PROCEDURES  Imaging and Procedures for @TODAY @  OCT, Retina - OU - Both Eyes       Right Eye Quality was good. Central Foveal Thickness: 233. Progression has been stable. Findings include abnormal foveal contour, no SRF, inner retinal atrophy, intraretinal hyper-reflective material, no IRF (Inner retinal atrophy superiorly -- stable, interval improvement in IRF superior hemisphere greatest along proximal ST arcade).   Left Eye Quality was good. Central Foveal Thickness: 260. Progression has been stable. Findings include normal foveal contour, no IRF, no SRF.   Notes *Images captured and stored on drive  Diagnosis / Impression:  OD: stable resolution in IRF along sup temp arcades;  inner-retinal hyperreflectivity deceased and diffuse atrophy -combined HRVO/HRAO improving OS: NFP, No IRF/SRF  Clinical management:  See below  Abbreviations: NFP - Normal foveal profile. CME - cystoid macular edema. PED - pigment epithelial detachment. IRF - intraretinal fluid. SRF - subretinal fluid. EZ - ellipsoid zone. ERM - epiretinal membrane. ORA - outer retinal atrophy. ORT - outer retinal tubulation. SRHM - subretinal hyper-reflective material         Intravitreal Injection, Pharmacologic Agent - OD - Right Eye       Time Out 07/08/2018. 3:41 PM. Confirmed correct patient, procedure, site, and patient consented.   Anesthesia Topical anesthesia  was used. Anesthetic medications included Lidocaine 2%, Proparacaine 0.5%.   Procedure Preparation included 5% betadine to ocular surface, eyelid speculum. A 30 gauge needle was used.   Injection:  1.25 mg Bevacizumab 1.25mg /0.47ml   NDC: 50242-060-01, Lot: [id number] , Expiration date: 09/04/2018   Route: Intravitreal, Site: Right Eye, Waste: 0 mL  Post-op Post injection exam found visual acuity of at least counting fingers. The patient tolerated the procedure well. There were no complications. The patient received written and verbal post procedure care education.                 ASSESSMENT/PLAN:    ICD-10-CM   1. Branch retinal vein occlusion of right eye with macular edema H34.8310 OCT, Retina - OU - Both Eyes    Intravitreal Injection, Pharmacologic Agent - OD - Right Eye    Bevacizumab (AVASTIN) SOLN 1.25 mg  2. BRAO (branch retinal artery occlusion), right H34.231 OCT, Retina - OU - Both Eyes  3. Retinal edema H35.81 OCT, Retina - OU - Both Eyes  4. Essential hypertension I10   5. Hypertensive retinopathy of both eyes H35.033   6. Diabetes mellitus type 2 without retinopathy (HCC) E11.9   7. 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 Dry eyes H04.123     1-3. Combined HRVO/HRAO with  macular edema OD-  - S/P IVA OD #1 (08.12.19), #2 (09.10.19) - BCVA remains 20/30 OD - exam shows sclerotic arterioles, dilated tortuous venules, scattered IRH and CWS throughout superior hemisphere + intraretinal fluid / edema - OCT shows atrophy with improved hyperreflective inner retina and CME along sup temp arcades - initial FA 8.12.19 showed severely delayed filling, massive vascular perfusion defects throughout superior hemisphere and late vascular leakage - recommend IVA OD #3 today, (10.15.19) - RBA of procedure discussed, questions answered - informed consent obtained and signed - see procedure note - recommend vascular work up including carotid dopplers, EKG, echocardiogram, CBC, CMP, possible hypercoag work up - F/U 4 weeks -- DFE/OCT/possible injection  4,5. Hypertensive retinopathy OU - discussed importance of tight BP control and likely contribution to above pathologies - monitor  6. Diabetes mellitus, type 2 without retinopathy - The incidence, risk factors for progression, natural history and treatment options for diabetic retinopathy  were discussed with patient.   - The need for close monitoring of blood glucose, blood pressure, and serum lipids, avoiding cigarette or any type of tobacco, and the need for long term follow up was also discussed with patient - f/u in 1 year, sooner prn  7. 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 - discussed diagnosis and progression - not yet visually significant - monitor for now  8. Dry eyes OU -  - recommend artificial tears and lubricating ointment as needed    Ophthalmic Meds Ordered this visit:  Meds ordered this encounter  Medications  . Bevacizumab (AVASTIN) SOLN 1.25 mg       Return in about 4 weeks (around 08/05/2018) for F/U HRVO/HRAO OD, DFE, OCT.  There are no Patient Instructions on file for this  visit.   Explained the diagnoses, plan, and follow up with the patient and they expressed understanding.  Patient expressed understanding of the importance of proper follow up care.   This document serves as a record of services personally performed by Karie Chimera, MD, PhD. It was created on their behalf by Virgilio Belling, COA, a  certified ophthalmic assistant. The creation of this record is the provider's dictation and/or activities during the visit.  Electronically signed by: Virgilio Belling, COA  10.14.19 12:38 PM   Karie Chimera, M.D., Ph.D. Diseases & Surgery of the Retina and Vitreous [facility]   I have reviewed the above documentation for accuracy and completeness, and I agree with the above. Karie Chimera, M.D., Ph.D. 07/09/18 12:38 PM    Abbreviations: M myopia (nearsighted); A astigmatism; H hyperopia (farsighted); P presbyopia; Mrx spectacle prescription;  CTL contact lenses; OD right eye; OS left eye; OU both eyes  XT exotropia; ET esotropia; PEK punctate epithelial keratitis; PEE punctate epithelial erosions; DES dry eye syndrome; MGD meibomian gland dysfunction; ATs artificial tears; PFAT's preservative free artificial tears; NSC nuclear sclerotic cataract; PSC posterior subcapsular cataract; ERM epi-retinal membrane; PVD posterior vitreous detachment; RD retinal detachment; DM diabetes mellitus; DR diabetic retinopathy; NPDR non-proliferative diabetic retinopathy; PDR proliferative diabetic retinopathy; CSME clinically significant macular edema; DME diabetic macular edema; dbh dot blot hemorrhages; CWS cotton wool spot; POAG primary open angle glaucoma; C/D cup-to-disc ratio; HVF humphrey visual field; GVF goldmann visual field; OCT optical coherence tomography; IOP intraocular pressure; BRVO Branch retinal vein occlusion; CRVO central retinal vein occlusion; CRAO central retinal artery occlusion; BRAO branch retinal artery occlusion; RT retinal  tear; SB scleral buckle; PPV pars plana vitrectomy; VH Vitreous hemorrhage; PRP panretinal laser photocoagulation; IVK intravitreal kenalog; VMT vitreomacular traction; MH Macular hole;  NVD neovascularization of the disc; NVE neovascularization elsewhere; AREDS age related eye disease study; ARMD age related macular degeneration; POAG primary open angle glaucoma; EBMD epithelial/anterior basement membrane dystrophy; ACIOL anterior chamber intraocular lens; IOL intraocular lens; PCIOL posterior chamber intraocular lens; Phaco/IOL phacoemulsification with intraocular lens placement; PRK photorefractive keratectomy; LASIK laser assisted in situ keratomileusis; HTN hypertension; DM diabetes mellitus; COPD chronic obstructive pulmonary disease

## 2018-07-08 ENCOUNTER — Encounter (INDEPENDENT_AMBULATORY_CARE_PROVIDER_SITE_OTHER): Payer: Self-pay | Admitting: Ophthalmology

## 2018-07-08 ENCOUNTER — Ambulatory Visit (INDEPENDENT_AMBULATORY_CARE_PROVIDER_SITE_OTHER): Payer: BC Managed Care – PPO | Admitting: Ophthalmology

## 2018-07-08 DIAGNOSIS — I10 Essential (primary) hypertension: Secondary | ICD-10-CM

## 2018-07-08 DIAGNOSIS — H04123 Dry eye syndrome of bilateral lacrimal glands: Secondary | ICD-10-CM

## 2018-07-08 DIAGNOSIS — H25813 Combined forms of age-related cataract, bilateral: Secondary | ICD-10-CM

## 2018-07-08 DIAGNOSIS — E119 Type 2 diabetes mellitus without complications: Secondary | ICD-10-CM

## 2018-07-08 DIAGNOSIS — H34831 Tributary (branch) retinal vein occlusion, right eye, with macular edema: Secondary | ICD-10-CM

## 2018-07-08 DIAGNOSIS — H35033 Hypertensive retinopathy, bilateral: Secondary | ICD-10-CM

## 2018-07-08 DIAGNOSIS — H34231 Retinal artery branch occlusion, right eye: Secondary | ICD-10-CM | POA: Diagnosis not present

## 2018-07-08 DIAGNOSIS — H3581 Retinal edema: Secondary | ICD-10-CM | POA: Diagnosis not present

## 2018-07-09 ENCOUNTER — Encounter (INDEPENDENT_AMBULATORY_CARE_PROVIDER_SITE_OTHER): Payer: Self-pay | Admitting: Ophthalmology

## 2018-07-09 DIAGNOSIS — H34831 Tributary (branch) retinal vein occlusion, right eye, with macular edema: Secondary | ICD-10-CM | POA: Diagnosis not present

## 2018-07-09 MED ORDER — BEVACIZUMAB CHEMO INJECTION 1.25MG/0.05ML SYRINGE FOR KALEIDOSCOPE
1.2500 mg | INTRAVITREAL | Status: DC
  Administered 2018-07-09: 1.25 mg via INTRAVITREAL

## 2018-08-12 ENCOUNTER — Encounter (INDEPENDENT_AMBULATORY_CARE_PROVIDER_SITE_OTHER): Payer: BC Managed Care – PPO | Admitting: Ophthalmology

## 2018-08-18 ENCOUNTER — Encounter: Payer: Self-pay | Admitting: Emergency Medicine

## 2018-08-18 ENCOUNTER — Inpatient Hospital Stay (HOSPITAL_COMMUNITY)
Admission: EM | Admit: 2018-08-18 | Discharge: 2018-08-26 | DRG: 065 | Disposition: A | Discharge status: Skilled Nursing Facility | Payer: BC Managed Care – PPO | Attending: Internal Medicine | Admitting: Internal Medicine

## 2018-08-18 ENCOUNTER — Encounter (HOSPITAL_COMMUNITY): Payer: PRIVATE HEALTH INSURANCE

## 2018-08-18 ENCOUNTER — Other Ambulatory Visit: Payer: Self-pay

## 2018-08-18 ENCOUNTER — Inpatient Hospital Stay (HOSPITAL_COMMUNITY): Payer: BC Managed Care – PPO

## 2018-08-18 ENCOUNTER — Emergency Department (HOSPITAL_COMMUNITY): Payer: BC Managed Care – PPO

## 2018-08-18 DIAGNOSIS — T796XXA Traumatic ischemia of muscle, initial encounter: Secondary | ICD-10-CM

## 2018-08-18 DIAGNOSIS — Z823 Family history of stroke: Secondary | ICD-10-CM | POA: Diagnosis not present

## 2018-08-18 DIAGNOSIS — E119 Type 2 diabetes mellitus without complications: Secondary | ICD-10-CM | POA: Diagnosis not present

## 2018-08-18 DIAGNOSIS — I10 Essential (primary) hypertension: Secondary | ICD-10-CM | POA: Diagnosis not present

## 2018-08-18 DIAGNOSIS — G8194 Hemiplegia, unspecified affecting left nondominant side: Secondary | ICD-10-CM | POA: Diagnosis present

## 2018-08-18 DIAGNOSIS — M6282 Rhabdomyolysis: Secondary | ICD-10-CM | POA: Diagnosis present

## 2018-08-18 DIAGNOSIS — Z23 Encounter for immunization: Secondary | ICD-10-CM | POA: Diagnosis not present

## 2018-08-18 DIAGNOSIS — I639 Cerebral infarction, unspecified: Secondary | ICD-10-CM

## 2018-08-18 DIAGNOSIS — E1165 Type 2 diabetes mellitus with hyperglycemia: Secondary | ICD-10-CM | POA: Diagnosis present

## 2018-08-18 DIAGNOSIS — I6340 Cerebral infarction due to embolism of unspecified cerebral artery: Secondary | ICD-10-CM | POA: Diagnosis present

## 2018-08-18 DIAGNOSIS — Z833 Family history of diabetes mellitus: Secondary | ICD-10-CM | POA: Diagnosis not present

## 2018-08-18 DIAGNOSIS — Y92003 Bedroom of unspecified non-institutional (private) residence as the place of occurrence of the external cause: Secondary | ICD-10-CM | POA: Diagnosis not present

## 2018-08-18 DIAGNOSIS — Z79899 Other long term (current) drug therapy: Secondary | ICD-10-CM

## 2018-08-18 DIAGNOSIS — R29711 NIHSS score 11: Secondary | ICD-10-CM | POA: Diagnosis present

## 2018-08-18 DIAGNOSIS — E139 Other specified diabetes mellitus without complications: Secondary | ICD-10-CM

## 2018-08-18 DIAGNOSIS — N179 Acute kidney failure, unspecified: Secondary | ICD-10-CM

## 2018-08-18 DIAGNOSIS — R531 Weakness: Secondary | ICD-10-CM | POA: Diagnosis present

## 2018-08-18 DIAGNOSIS — D72829 Elevated white blood cell count, unspecified: Secondary | ICD-10-CM

## 2018-08-18 DIAGNOSIS — Z7984 Long term (current) use of oral hypoglycemic drugs: Secondary | ICD-10-CM | POA: Diagnosis not present

## 2018-08-18 DIAGNOSIS — W06XXXA Fall from bed, initial encounter: Secondary | ICD-10-CM | POA: Diagnosis present

## 2018-08-18 DIAGNOSIS — E1122 Type 2 diabetes mellitus with diabetic chronic kidney disease: Secondary | ICD-10-CM | POA: Diagnosis present

## 2018-08-18 DIAGNOSIS — R29810 Facial weakness: Secondary | ICD-10-CM | POA: Diagnosis present

## 2018-08-18 DIAGNOSIS — N183 Chronic kidney disease, stage 3 unspecified: Secondary | ICD-10-CM | POA: Diagnosis present

## 2018-08-18 DIAGNOSIS — E785 Hyperlipidemia, unspecified: Secondary | ICD-10-CM | POA: Diagnosis present

## 2018-08-18 DIAGNOSIS — R4701 Aphasia: Secondary | ICD-10-CM | POA: Diagnosis present

## 2018-08-18 DIAGNOSIS — I129 Hypertensive chronic kidney disease with stage 1 through stage 4 chronic kidney disease, or unspecified chronic kidney disease: Secondary | ICD-10-CM | POA: Diagnosis present

## 2018-08-18 DIAGNOSIS — E1151 Type 2 diabetes mellitus with diabetic peripheral angiopathy without gangrene: Secondary | ICD-10-CM | POA: Diagnosis present

## 2018-08-18 DIAGNOSIS — I6300 Cerebral infarction due to thrombosis of unspecified precerebral artery: Secondary | ICD-10-CM | POA: Diagnosis not present

## 2018-08-18 LAB — ETHANOL

## 2018-08-18 LAB — COMPREHENSIVE METABOLIC PANEL
ALBUMIN: 4 g/dL (ref 3.5–5.0)
ALK PHOS: 89 U/L (ref 38–126)
ALT: 77 U/L — AB (ref 0–44)
AST: 46 U/L — AB (ref 15–41)
Anion gap: 13 (ref 5–15)
BUN: 61 mg/dL — ABNORMAL HIGH (ref 8–23)
CALCIUM: 9.1 mg/dL (ref 8.9–10.3)
CHLORIDE: 103 mmol/L (ref 98–111)
CO2: 25 mmol/L (ref 22–32)
CREATININE: 1.88 mg/dL — AB (ref 0.44–1.00)
GFR calc non Af Amer: 28 mL/min — ABNORMAL LOW (ref >60)
GFR, EST AFRICAN AMERICAN: 32 mL/min — AB (ref >60)
GLUCOSE: 190 mg/dL — AB (ref 70–99)
Potassium: 4.2 mmol/L (ref 3.5–5.1)
SODIUM: 141 mmol/L (ref 135–145)
Total Bilirubin: 1.1 mg/dL (ref 0.3–1.2)
Total Protein: 7.6 g/dL (ref 6.5–8.1)

## 2018-08-18 LAB — I-STAT CHEM 8, ED
BUN: 65 mg/dL — ABNORMAL HIGH (ref 8–23)
Calcium, Ion: 0.95 mmol/L — ABNORMAL LOW (ref 1.15–1.40)
Chloride: 107 mmol/L (ref 98–111)
Creatinine, Ser: 1.7 mg/dL — ABNORMAL HIGH (ref 0.44–1.00)
GLUCOSE: 197 mg/dL — AB (ref 70–99)
HCT: 44 % (ref 36.0–46.0)
HEMOGLOBIN: 15 g/dL (ref 12.0–15.0)
POTASSIUM: 4.6 mmol/L (ref 3.5–5.1)
Sodium: 138 mmol/L (ref 135–145)
TCO2: 24 mmol/L (ref 22–32)

## 2018-08-18 LAB — CBC
HCT: 43.9 % (ref 36.0–46.0)
HEMOGLOBIN: 13.6 g/dL (ref 12.0–15.0)
MCH: 26.2 pg (ref 26.0–34.0)
MCHC: 31 g/dL (ref 30.0–36.0)
MCV: 84.4 fL (ref 80.0–100.0)
Platelets: 276 10*3/uL (ref 150–400)
RBC: 5.2 MIL/uL — ABNORMAL HIGH (ref 3.87–5.11)
RDW: 15.1 % (ref 11.5–15.5)
WBC: 16.1 10*3/uL — ABNORMAL HIGH (ref 4.0–10.5)
nRBC: 0 % (ref 0.0–0.2)

## 2018-08-18 LAB — DIFFERENTIAL
ABS IMMATURE GRANULOCYTES: 0.07 10*3/uL (ref 0.00–0.07)
BASOS ABS: 0 10*3/uL (ref 0.0–0.1)
BASOS PCT: 0 %
Eosinophils Absolute: 0 10*3/uL (ref 0.0–0.5)
Eosinophils Relative: 0 %
IMMATURE GRANULOCYTES: 0 %
LYMPHS PCT: 8 %
Lymphs Abs: 1.3 10*3/uL (ref 0.7–4.0)
Monocytes Absolute: 1.3 10*3/uL — ABNORMAL HIGH (ref 0.1–1.0)
Monocytes Relative: 8 %
NEUTROS ABS: 13.4 10*3/uL — AB (ref 1.7–7.7)
NEUTROS PCT: 84 %

## 2018-08-18 LAB — GLUCOSE, CAPILLARY: Glucose-Capillary: 148 mg/dL — ABNORMAL HIGH (ref 70–99)

## 2018-08-18 LAB — APTT: APTT: 27 s (ref 24–36)

## 2018-08-18 LAB — PROTIME-INR
INR: 1.12
Prothrombin Time: 14.3 seconds (ref 11.4–15.2)

## 2018-08-18 LAB — I-STAT TROPONIN, ED: Troponin i, poc: 0.07 ng/mL (ref 0.00–0.08)

## 2018-08-18 LAB — CK: Total CK: 1154 U/L — ABNORMAL HIGH (ref 38–234)

## 2018-08-18 MED ORDER — SENNOSIDES-DOCUSATE SODIUM 8.6-50 MG PO TABS
1.0000 | ORAL_TABLET | Freq: Every evening | ORAL | Status: DC | PRN
  Administered 2018-08-19 – 2018-08-24 (×2): 1 via ORAL
  Filled 2018-08-18 (×2): qty 1

## 2018-08-18 MED ORDER — INSULIN ASPART 100 UNIT/ML ~~LOC~~ SOLN
0.0000 [IU] | Freq: Three times a day (TID) | SUBCUTANEOUS | Status: DC
  Administered 2018-08-19 (×2): 2 [IU] via SUBCUTANEOUS
  Administered 2018-08-21: 8 [IU] via SUBCUTANEOUS
  Administered 2018-08-21 – 2018-08-22 (×3): 3 [IU] via SUBCUTANEOUS
  Administered 2018-08-22: 2 [IU] via SUBCUTANEOUS
  Administered 2018-08-23: 8 [IU] via SUBCUTANEOUS
  Administered 2018-08-23 (×2): 2 [IU] via SUBCUTANEOUS
  Administered 2018-08-24: 3 [IU] via SUBCUTANEOUS
  Administered 2018-08-24: 2 [IU] via SUBCUTANEOUS
  Administered 2018-08-24: 3 [IU] via SUBCUTANEOUS
  Administered 2018-08-25 (×2): 2 [IU] via SUBCUTANEOUS
  Administered 2018-08-25: 3 [IU] via SUBCUTANEOUS
  Administered 2018-08-26: 2 [IU] via SUBCUTANEOUS

## 2018-08-18 MED ORDER — SODIUM CHLORIDE 0.9 % IV SOLN: INTRAVENOUS | Status: DC

## 2018-08-18 MED ORDER — CLOPIDOGREL BISULFATE 75 MG PO TABS
300.0000 mg | ORAL_TABLET | Freq: Once | ORAL | Status: AC
  Administered 2018-08-18: 300 mg via ORAL
  Filled 2018-08-18: qty 4

## 2018-08-18 MED ORDER — SODIUM CHLORIDE 0.9 % IV SOLN
INTRAVENOUS | Status: DC
  Administered 2018-08-18 – 2018-08-20 (×3): via INTRAVENOUS

## 2018-08-18 MED ORDER — ASPIRIN 325 MG PO TABS
325.0000 mg | ORAL_TABLET | Freq: Every day | ORAL | Status: DC
  Administered 2018-08-18 – 2018-08-26 (×9): 325 mg via ORAL
  Filled 2018-08-18 (×9): qty 1

## 2018-08-18 MED ORDER — WHITE PETROLATUM EX OINT
TOPICAL_OINTMENT | CUTANEOUS | Status: DC | PRN
  Filled 2018-08-18: qty 28.35

## 2018-08-18 MED ORDER — ACETAMINOPHEN 160 MG/5ML PO SOLN: 650.0000 mg | ORAL | Status: DC | PRN

## 2018-08-18 MED ORDER — STROKE: EARLY STAGES OF RECOVERY BOOK
Freq: Once | Status: DC
  Filled 2018-08-18 (×3): qty 1

## 2018-08-18 MED ORDER — ENOXAPARIN SODIUM 30 MG/0.3ML ~~LOC~~ SOLN
30.0000 mg | SUBCUTANEOUS | Status: DC
  Administered 2018-08-19: 30 mg via SUBCUTANEOUS
  Filled 2018-08-18 (×2): qty 0.3

## 2018-08-18 MED ORDER — ACETAMINOPHEN 325 MG PO TABS: 650.0000 mg | ORAL_TABLET | ORAL | Status: DC | PRN

## 2018-08-18 MED ORDER — ATORVASTATIN CALCIUM 80 MG PO TABS
80.0000 mg | ORAL_TABLET | Freq: Every day | ORAL | Status: DC
  Administered 2018-08-19: 80 mg via ORAL
  Filled 2018-08-18: qty 1

## 2018-08-18 MED ORDER — CLOPIDOGREL BISULFATE 75 MG PO TABS
75.0000 mg | ORAL_TABLET | Freq: Every day | ORAL | Status: DC
  Administered 2018-08-19: 75 mg via ORAL
  Filled 2018-08-18: qty 1

## 2018-08-18 MED ORDER — ACETAMINOPHEN 650 MG RE SUPP: 650.0000 mg | RECTAL | Status: DC | PRN

## 2018-08-18 MED ORDER — INSULIN ASPART 100 UNIT/ML ~~LOC~~ SOLN: 0.0000 [IU] | Freq: Every day | SUBCUTANEOUS | Status: DC

## 2018-08-18 NOTE — ED Notes (Signed)
Patient is resting comfortably. 

## 2018-08-18 NOTE — ED Notes (Signed)
PT determined to be VAN appropriate- team at beside attempting to get IV 18G started with US.

## 2018-08-18 NOTE — ED Notes (Signed)
MD to update

## 2018-08-18 NOTE — ED Notes (Signed)
Service response called, hospital bed ordered per RN

## 2018-08-18 NOTE — ED Notes (Signed)
Patient transported to MRI 

## 2018-08-18 NOTE — ED Notes (Signed)
No urine collected yet by purewick, verified placement and performed bladder scan (174cc)

## 2018-08-18 NOTE — ED Notes (Signed)
Called 3W to give report.  Receiving RN unable to take report at this time. 

## 2018-08-18 NOTE — H&P (Addendum)
History and Physical    [name]:[id number] DOB: Dec 06, 1955 DOA: 08/18/2018  PCP: Renford Dills, MD Consultants:  Neuro Patient coming from: home- lives alone Chief Complaint: Found down  HPI: Catherine Boyd is a 62 y.o. female with medical history significant for hypertension, diabetes not on insulin, who was brought to the ED today by EMS after being found down at home.  She states that she went to bed on Thursday night and felt normal, woke up in the morning on Friday, went to use the restroom and when she came back to get in bed she states that she was unable to move and fell to the floor.  She denies any headache, lightheadedness, confusion or pain preceding this.  She cannot say why she was unable to get off the floor.  She states that she did not feel weak but she just felt "struck down" and unable to get up.  She is a professor at [employer] and her colleagues realized she had not come to work and became worried about her and 1 of them found her on the floor of her home this morning.  She was awake and alert.  Patient states that she was alert the whole weekend but unable to move.  Currently she has no complaints.  She takes glipizide for her diabetes and Cozaar for hypertension.  She states that her blood pressure is usually controlled with systolics in the 120s to 130s.  She does not take any aspirin or other blood thinners.  She states that both her father and mother had strokes.  She does not smoke.     ED Course: CT of the head showed an acute nonhemorrhagic infarct suspected involving the left corona radiata/lenticular nucleus.  Also a remote small anterior medial left thalamic infarct.  She was awake and alert and able to communicate though severely dysarthric.  She was somewhat hypertensive at 158/81, heart rate 117, respirations 17, 99% on room air.  She was afebrile.  Review of Systems: As per HPI; otherwise review of systems reviewed and negative.    Ambulatory Status: Normally ambulatory, currently nonambulatory   Past Medical History:  Diagnosis Date  . Diabetes 1.5, managed as type 2 (HCC)   . Diabetes mellitus without complication (HCC)   . Hypertension     History reviewed. No pertinent surgical history.  Social History   Socioeconomic History  . Marital status: Unknown    Spouse name: Not on file  . Number of children: Not on file  . Years of education: Not on file  . Highest education level: Not on file  Occupational History  . Not on file  Social Needs  . Financial resource strain: Not on file  . Food insecurity:    Worry: Not on file    Inability: Not on file  . Transportation needs:    Medical: Not on file    Non-medical: Not on file  Tobacco Use  . Smoking status: Never Smoker  . Smokeless tobacco: Never Used  Substance and Sexual Activity  . Alcohol use: Yes    Alcohol/week: 1.0 standard drinks    Types: 1 Glasses of wine per week    Frequency: Never  . Drug use: Never  . Sexual activity: Not on file  Lifestyle  . Physical activity:    Days per week: Not on file    Minutes per session: Not on file  . Stress: Not on file  Relationships  . Social connections:  Talks on phone: Not on file    Gets together: Not on file    Attends religious service: Not on file    Active member of club or organization: Not on file    Attends meetings of clubs or organizations: Not on file    Relationship status: Not on file  . Intimate partner violence:    Fear of current or ex partner: Not on file    Emotionally abused: Not on file    Physically abused: Not on file    Forced sexual activity: Not on file  Other Topics Concern  . Not on file  Social History Narrative  . Not on file    Allergies  Allergen Reactions  . Penicillins Other (See Comments)    fainted    Family History  Problem Relation Age of Onset  . Stroke Mother   . Diabetes Paternal Uncle     Prior to Admission medications    Medication Sig Start Date End Date Taking? Authorizing Provider  atorvastatin (LIPITOR) 10 MG tablet Take 10 mg by mouth daily.   Yes [provider]  glimepiride (AMARYL) 2 MG tablet Take 2 mg by mouth daily with breakfast.   Yes [provider]  losartan (COZAAR) 50 MG tablet Take 50 mg by mouth daily.   Yes [provider]    Physical Exam: Vitals:   08/18/18 1215 08/18/18 1230 08/18/18 1245 08/18/18 1300  BP: (!) 154/89 (!) 164/88 (!) 177/81 (!) 158/81  Pulse: (!) 113 (!) 117  (!) 117  Resp: 16 18 18 17   Temp:      TempSrc:      SpO2: 98% 100%  99%     . General: Appears calm and comfortable and is in NAD . Eyes:  PERRL, EOMI, normal lids, iris . ENT:  grossly normal hearing, lips & tongue, mmm . Neck:  supple, no lymphadenopathy . Cardiovascular:  nL S1, S2, tachycardic, reg rhythm, no murmur. Marland Kitchen. Respiratory:   CTA bilaterally with no wheezes/rales/rhonchi.  Normal respiratory effort. . Abdomen:  soft, NT, ND, NABS . Back:   grossly normal alignment . Skin:  no rash or lesions seen on limited exam . Musculoskeletal:  grossly normal tone BUE/BLE, good ROM, no bony abnormality or obvious joint deformity . Lower extremities:  No LE edema.  Limited foot exam with no ulcerations.  2+ distal pulses. Marland Kitchen. Psychiatric:  grossly normal mood and affect, speech fluent and appropriate, AOx3 . Neurologic:, Alert, oriented to person place month year and situation.  She is able to give a history but has significant dysarthria and difficulty getting words out.  Right facial droop.  Right upper extremity weakness.    Radiological Exams on Admission: Ct Head Wo Contrast  Result Date: 08/18/2018 CLINICAL DATA:  62 year old female found down.  Initial encounter. EXAM: CT HEAD WITHOUT CONTRAST TECHNIQUE: Contiguous axial images were obtained from the base of the skull through the vertex without intravenous contrast. COMPARISON:  None. FINDINGS: Brain: Acute  nonhemorrhagic infarct suspected involving the left corona radiata/lenticular nucleus. Remote small anteromedial left thalamic infarct. Prominent chronic microvascular changes. No intracranial hemorrhage. Global atrophy. Slight asymmetry lateral ventricles without mass seen causing this appearance. The ventricular size is most likely related to atrophy rather hydrocephalus. Aqueduct is patent. No intracranial mass lesion noted on this unenhanced exam. Vascular: Vascular calcifications. Intracranial vasculature is ectatic with calcified plaque cavernous segment internal carotid arteries and portion of vertebral arteries. Skull: No skull fracture Sinuses/Orbits: No acute orbital abnormality.  Visualized paranasal sinuses are clear. Other: Mastoid air cells and middle ear cavities are clear. IMPRESSION: 1. Acute nonhemorrhagic infarct suspected involving the left corona radiata/lenticular nucleus. 2. Remote small anteromedial left thalamic infarct. 3. Prominent chronic microvascular changes. 4. No intracranial hemorrhage. 5. Global atrophy. These results were called by telephone at the time of interpretation on 08/18/2018 at 12:31 pm to Dr. Azalia Bilis , who verbally acknowledged these results. Electronically Signed   By: Lacy Duverney M.D.   On: 08/18/2018 12:40    EKG: Independently reviewed.  Rate 116 Sinus tachycardia Probable left atrial enlargement Left ventricular hypertrophy Artifact in lead(s) I II III aVR aVL aVF V1 V5 V6   Labs on Admission: I have personally reviewed the available labs and imaging studies at the time of the admission.  Pertinent labs:  Sodium 138 potassium 4.6 chloride 107 CO2 25 glucose 197 BUN 65 creatinine 1.7.  Repeat potassium 4.2, repeat creatinine 1.88, BUN 61.  Calcium 9.1 AST 46 ALT 77 otherwise LFTs within normal limits TnI 0.07 WBC 16.1 hemoglobin 13.6 platelets 276 Ethyl alcohol less than 10 INR 1.12 PTT 27   Assessment/Plan Principal Problem:   CVA  (cerebral vascular accident) (HCC)   Acute CVA: Risk factors include diabetes, hypertension, family history. -Follow-up brain MRI, MRA -Carotid Dopplers ordered -TTE ordered -frequent neuro checks -PT/OT/ST -As it has been 3 days, okay to lower blood pressure slowly with a goal of systolic 140/90 per neuro; hold Cozaar -Hemoglobin A1c, lipid panel -start ASA 325 mg daily -Continue Lipitor, increase to 80 mg daily -Check CK and trend if elevated   AKI on CKD III: Care everywhere shows her last creatinine 1.3 with a BUN of 30.  Likely prerenal from not eating or drinking all weekend.  -We will give normal saline at 125 cc an hour to start -Follow-up renal function panel in the morning -Check CK and trend if elevated. If rhabdo, increase rate of IVF  Hypertension: -per neuro, goal BP 140/90 -holding cozaar anyway for AKI  Diabetes mellitus type 2, uncontrolled.  Last hemoglobin A1c from June 05, 2016 was 11.2. -Hold home glipizide -Sliding scale insulin -Carb controlled diet if passes swallow screen -Check hemoglobin A1c in the morning Hypertension -Blood pressure goal 140/90 -Hold Cozaar for now  Leukocytosis, likely due to hemoconcentration from volume depletion.  No evidence of infection. -IVF as above -Follow-up CBC tomorrow    DVT prophylaxis: lovenox Code Status:  Full - confirmed with patient/family Family Communication: none. Friend at bedside  Disposition Plan: TBD, likely inpatient rehab in 3-4 days Consults called: Neurology  Admission status: Admit - It is my clinical opinion that admission to INPATIENT is reasonable and necessary because of the expectation that this patient will require hospital care that crosses at least 2 midnights to treat this condition based on the medical complexity of the problems presented.  Given the aforementioned information, the predictability of an adverse outcome is felt to be significant.     Elyse Hsu MD [facility]  If note is complete, please contact covering daytime or nighttime physician. [email or web address] Password [email or web address]  08/18/2018, 1:40 PM

## 2018-08-18 NOTE — Consult Note (Addendum)
Neurology Consultation  Reason for Consult: Stroke Referring Physician: M post  CC: Left-sided weakness  History is obtained from: Patient  HPI: Catherine Boyd is a 62 y.o. female with past medical history of hypertension and diabetes.  Patient states that she does not take aspirin.  Patient does take a blood pressure medication states her systolic blood pressures usually in the 120s to 130s.  Patient was last seen normal on Thursday.  Patient went to bed when she awoke she noted that she fell out of her bed because her right leg was not strong enough to hold her up.  Unfortunately she was unable to get to a phone and she laid on the floor for 2 days until she was found this morning.  Currently her blood pressure up but as she notes that she has not been able to get a for medications.  Neurology was called to consult.  ED course: CT of head  LKW: 08/15/2018 unknown exact time. tpa given?: no, out of window Premorbid modified Rankin scale (mRS): 0 NIH stroke scale 11 ICH Score: 0   ROS:  ROS was performed and is negative except as noted in the HPI.   Past Medical History:  Diagnosis Date  . Diabetes 1.5, managed as type 2 (HCC)   . Diabetes mellitus without complication (HCC)   . Hypertension    Family History  Problem Relation Age of Onset  . Stroke Mother   . Diabetes Paternal Uncle    Social History:   reports that she has never smoked. She has never used smokeless tobacco. She reports that she drinks about 1.0 standard drinks of alcohol per week. She reports that she does not use drugs.  Medications  Current Facility-Administered Medications:  .  Bevacizumab (AVASTIN) SOLN 1.25 mg, 1.25 mg, Intravitreal, , Rennis Chris, MD, 1.25 mg at 05/05/18 1625 .  Bevacizumab (AVASTIN) SOLN 1.25 mg, 1.25 mg, Intravitreal, , Rennis Chris, MD, 1.25 mg at 06/04/18 2331 .  Bevacizumab (AVASTIN) SOLN 1.25 mg, 1.25 mg, Intravitreal, , Rennis Chris, MD, 1.25 mg at 07/09/18  0151  Current Outpatient Medications:  .  atorvastatin (LIPITOR) 10 MG tablet, Take 10 mg by mouth daily., Disp: , Rfl:  .  glimepiride (AMARYL) 2 MG tablet, Take 2 mg by mouth daily with breakfast., Disp: , Rfl:  .  losartan (COZAAR) 50 MG tablet, Take 50 mg by mouth daily., Disp: , Rfl:    Exam: Current vital signs: BP (!) 158/81   Pulse (!) 117   Temp 98.4 F (36.9 C) (Oral)   Resp 17   SpO2 99%  Vital signs in last 24 hours: Temp:  [98.4 F (36.9 C)] 98.4 F (36.9 C) (11/25 1014) Pulse Rate:  [113-119] 117 (11/25 1300) Resp:  [15-19] 17 (11/25 1300) BP: (154-177)/(81-108) 158/81 (11/25 1300) SpO2:  [93 %-100 %] 99 % (11/25 1300)  Physical Exam  Constitutional: Appears well-developed and well-nourished.  Psych: Affect appropriate to situation Eyes: No scleral injection HENT: No OP obstrucion Head: Normocephalic.  Cardiovascular: Normal rate and regular rhythm.  Respiratory: Effort normal, non-labored breathing GI: Soft.  No distension. There is no tenderness.  Skin: WDI, bruises noted and on the right aspect of face and right knee  Neuro: Mental Status: Patient is awake, alert, oriented to person, place, month, year, and situation. Patient is able to give a clear and coherent history. No aphasia but she has significant dysarthria Cranial Nerves: II: Visual Fields are full.  III,IV, VI: EOMI however it  is noted that it appears that her left eye has more movement in her right eye.  No noted diploplia. Pupils are equal, round, and reactive to light.  V: Facial sensation is symmetric to temperature VII: Facial movement shows a significant right facial droop VIII: hearing is intact to voice X: Uvula elevates symmetrically XI: Right shoulder does not move as much his left shoulder XII: tongue is midline without atrophy or fasciculations.  Motor: Patient is unable to lift her right arm but she does have 3/5 strength with bicep and 2/5 right tricep she does have a wrist  drop on the right side.  As far as her leg, she only can lift it approximately 1 inch off the bed.  However with hip flexion she does show 4/5 strength and with ankle flexion and plantar flexion she does show 4/5 strength.  Left side has full 5/5 strength Sensory: Decreased sensation on her right arm otherwise normal Deep Tendon Reflexes: No ankle jerk, 2+ knee jerk, 2+ upper extremity reflexes Plantars: Toes are downgoing bilaterally.  Cerebellar: Finger-to-nose and heel-to-shin on the left are normal on the right unable to do  Labs I have reviewed labs in epic and the results pertinent to this consultation are:   CBC    Component Value Date/Time   WBC 16.1 (H) 08/18/2018 1111   RBC 5.20 (H) 08/18/2018 1111   HGB 13.6 08/18/2018 1111   HCT 43.9 08/18/2018 1111   PLT 276 08/18/2018 1111   MCV 84.4 08/18/2018 1111   MCH 26.2 08/18/2018 1111   MCHC 31.0 08/18/2018 1111   RDW 15.1 08/18/2018 1111   LYMPHSABS 1.3 08/18/2018 1111   MONOABS 1.3 (H) 08/18/2018 1111   EOSABS 0.0 08/18/2018 1111   BASOSABS 0.0 08/18/2018 1111    CMP     Component Value Date/Time   NA 141 08/18/2018 1111   K 4.2 08/18/2018 1111   CL 103 08/18/2018 1111   CO2 25 08/18/2018 1111   GLUCOSE 190 (H) 08/18/2018 1111   BUN 61 (H) 08/18/2018 1111   CREATININE 1.88 (H) 08/18/2018 1111   CALCIUM 9.1 08/18/2018 1111   PROT 7.6 08/18/2018 1111   ALBUMIN 4.0 08/18/2018 1111   AST 46 (H) 08/18/2018 1111   ALT 77 (H) 08/18/2018 1111   ALKPHOS 89 08/18/2018 1111   BILITOT 1.1 08/18/2018 1111   GFRNONAA 28 (L) 08/18/2018 1111   GFRAA 32 (L) 08/18/2018 1111    Lipid Panel  No results found for: CHOL, TRIG, HDL, CHOLHDL, VLDL, LDLCALC, LDLDIRECT   Imaging I have reviewed the images obtained:  CT-scan of the brain-acute nonhemorrhagic infarct suspected involving the left corona radiata/lenticular nucleus.  In addition remote small anterior medial left thalamic infarct  MRI examination of the  brain  Felicie Morn PA-C [facility] [phone]  M-F  (9:00 am- 5:00 PM)  08/18/2018, 1:30 PM     Assessment:  62 year old female presenting to the hospital with a subacute left corona radiata infarct.    Recommendations:  # MRI of the brain without contrast #MRA Head and neck  #Transthoracic Echo,  # Start patient on ASA 325mg  daily, added Plavix  #Start or continue Atorvastatin 80 mg/other high intensity statin # BP goal: As it is been 3 days it is okay to lower blood pressure slowly with a goal of systolic 140/90 # HBAIC and Lipid profile # Telemetry monitoring # Frequent neuro checks # NPO until passes stroke swallow screen # please page stroke NP  Or  PA  Or MD from 8am -4 pm  as this patient from this time will be  followed by the stroke.   You can look them up on [email or web address]  Password [email or web address]   NEUROHOSPITALIST ADDENDUM Performed a face to face diagnostic evaluation.   I have reviewed the contents of history and physical exam as documented by PA/ARNP/Resident and agree with above documentation.  I have discussed and formulated the above plan as documented. Edits to the note have been made as needed.   62 year old female with hypertension diabetes presents after having symptoms started Friday patient was been found down due to right leg weakness.  Patient states she laid on the floor for 2 days and she was unable to get up and use a phone.  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  On my assessment patient has no visual neglect, hemiparesis of the right side.  So appeared mildly confused with severe dysarthria. MRI brain shows a left basal ganglia/corona radiata lagune.  MRA was negative for large vessel occlusion.  She also has a subacute infarction on the right pons. Does have small chronic microhemorrhages in SWI sequence, likely from chronic HTN.   We will start patient on aspirin and Plavix for 3 weeks. Will obtain Carotid  doppler and Echo.    Georgiana SpinnerSushanth Ryder Chesmore MD [facility] [phone]   If 7pm to 7am, please call on call as listed on AMION.

## 2018-08-18 NOTE — ED Triage Notes (Signed)
R sided weakness and facial paralysis. Found prone in bedroom by police. 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 Last seen by neighbors Thurs morning taking out trash.

## 2018-08-18 NOTE — ED Provider Notes (Signed)
[facility] EMERGENCY DEPARTMENT Provider Note   CSN: [id number] Arrival date & time: 08/18/18  1002     History   Chief Complaint Chief Complaint  Patient presents with  . Cerebrovascular Accident   Level 5 caveat: Dysarthria  HPI Catherine Boyd is a 62 y.o. female.  HPI Patient is a 62 year old female with a history of diabetes and hypertension and no prior history of stroke who was found on the floor next to her bed today.  It is suspected that she has laid there for the last 3 days.  She was last seen normal on Thursday afternoon.  She reports that she fell Friday morning is been unable to get up with from the bed since then.  She was found laying in urine.  She has contracture of her right upper extremity and weakness of her right upper extremity as well as some mild dysarthric speech.  Complains of mild headache at this time. Past Medical History:  Diagnosis Date  . Diabetes 1.5, managed as type 2 (HCC)   . Diabetes mellitus without complication (HCC)   . Hypertension     Patient Active Problem List   Diagnosis Date Noted  . Chronic kidney disease, stage III (moderate) (HCC) 08/18/2018  . Diabetes mellitus (HCC) 08/18/2018  . Hypertension 08/18/2018  . CVA (cerebral vascular accident) (HCC) 08/18/2018  . AKI (acute kidney injury) (HCC) 08/18/2018    History reviewed. No pertinent surgical history.   OB History   None      Home Medications    Prior to Admission medications   Medication Sig Start Date End Date Taking? Authorizing Provider  atorvastatin (LIPITOR) 10 MG tablet Take 10 mg by mouth daily.   Yes [provider]  glimepiride (AMARYL) 2 MG tablet Take 2 mg by mouth daily with breakfast.   Yes [provider]  losartan (COZAAR) 50 MG tablet Take 50 mg by mouth daily.   Yes [provider]    Family History Family History  Problem Relation Age of Onset  . Stroke Mother   . Stroke Father   .  Hypertension Father   . Diabetes Paternal Uncle     Social History Social History   Tobacco Use  . Smoking status: Never Smoker  . Smokeless tobacco: Never Used  Substance Use Topics  . Alcohol use: Yes    Alcohol/week: 1.0 standard drinks    Types: 1 Glasses of wine per week    Frequency: Never  . Drug use: Never     Allergies   Penicillins   Review of Systems Review of Systems  Unable to perform ROS: Mental status change     Physical Exam Updated Vital Signs BP 128/63   Pulse (!) 109   Temp 98.4 F (36.9 C) (Oral)   Resp (!) 27   SpO2 98%   Physical Exam  Constitutional: She appears well-developed and well-nourished. No distress.  HENT:  Head: Normocephalic and atraumatic.  Eyes: EOM are normal.  Neck: Normal range of motion.  Cardiovascular: Normal rate, regular rhythm and normal heart sounds.  Pulmonary/Chest: Effort normal and breath sounds normal.  Abdominal: Soft. She exhibits no distension. There is no tenderness.  Musculoskeletal: Normal range of motion.  Neurological: She is alert.  Dysarthric speech.  Contracture of the right upper extremity.  Unable to raise the right upper extremity against gravity.  4 out of 5 strength of the right lower extremity.  5 out of 5 strength of the  left upper and left lower extremity  Skin: Skin is warm and dry.  Psychiatric: She has a normal mood and affect. Judgment normal.  Nursing note and vitals reviewed.    ED Treatments / Results  Labs (all labs ordered are listed, but only abnormal results are displayed) Labs Reviewed  CBC - Abnormal; Notable for the following components:      Result Value   WBC 16.1 (*)    RBC 5.20 (*)    All other components within normal limits  DIFFERENTIAL - Abnormal; Notable for the following components:   Neutro Abs 13.4 (*)    Monocytes Absolute 1.3 (*)    All other components within normal limits  COMPREHENSIVE METABOLIC PANEL - Abnormal; Notable for the following  components:   Glucose, Bld 190 (*)    BUN 61 (*)    Creatinine, Ser 1.88 (*)    AST 46 (*)    ALT 77 (*)    GFR calc non Af Amer 28 (*)    GFR calc Af Amer 32 (*)    All other components within normal limits  CK - Abnormal; Notable for the following components:   Total CK 1,154 (*)    All other components within normal limits  I-STAT CHEM 8, ED - Abnormal; Notable for the following components:   BUN 65 (*)    Creatinine, Ser 1.70 (*)    Glucose, Bld 197 (*)    Calcium, Ion 0.95 (*)    All other components within normal limits  ETHANOL  PROTIME-INR  APTT  RAPID URINE DRUG SCREEN, HOSP PERFORMED  URINALYSIS, ROUTINE W REFLEX MICROSCOPIC  HIV ANTIBODY (ROUTINE TESTING W REFLEX)  HEMOGLOBIN A1C  LIPID PANEL  CK  I-STAT TROPONIN, ED    EKG EKG Interpretation  Date/Time:  Monday August 18 2018 10:12:40 EST Ventricular Rate:  116 PR Interval:    QRS Duration: 211 QT Interval:  332 QTC Calculation: 462 R Axis:   -15 Text Interpretation:  Sinus tachycardia Probable left atrial enlargement Left ventricular hypertrophy Artifact in lead(s) I II III aVR aVL aVF V1 V5 V6 No old tracing to compare Confirmed by Azalia Bilisampos, Hadli Vandemark ([id number]) on 08/18/2018 1:04:17 PM   Radiology Ct Head Wo Contrast  Result Date: 08/18/2018 CLINICAL DATA:  62 year old female found down.  Initial encounter. EXAM: CT HEAD WITHOUT CONTRAST TECHNIQUE: Contiguous axial images were obtained from the base of the skull through the vertex without intravenous contrast. COMPARISON:  None. FINDINGS: Brain: Acute nonhemorrhagic infarct suspected involving the left corona radiata/lenticular nucleus. Remote small anteromedial left thalamic infarct. Prominent chronic microvascular changes. No intracranial hemorrhage. Global atrophy. Slight asymmetry lateral ventricles without mass seen causing this appearance. The ventricular size is most likely related to atrophy rather hydrocephalus. Aqueduct is patent. No intracranial mass  lesion noted on this unenhanced exam. Vascular: Vascular calcifications. Intracranial vasculature is ectatic with calcified plaque cavernous segment internal carotid arteries and portion of vertebral arteries. Skull: No skull fracture Sinuses/Orbits: No acute orbital abnormality. Visualized paranasal sinuses are clear. Other: Mastoid air cells and middle ear cavities are clear. IMPRESSION: 1. Acute nonhemorrhagic infarct suspected involving the left corona radiata/lenticular nucleus. 2. Remote small anteromedial left thalamic infarct. 3. Prominent chronic microvascular changes. 4. No intracranial hemorrhage. 5. Global atrophy. These results were called by telephone at the time of interpretation on 08/18/2018 at 12:31 pm to Dr. Azalia BilisKEVIN Lenoria Narine , who verbally acknowledged these results. Electronically Signed   By: Lacy DuverneySteven  Olson M.D.   On: 08/18/2018 12:40  Procedures .Critical Care Performed by: Azalia Bilis, MD Authorized by: Azalia Bilis, MD    CRITICAL CARE Performed by: Azalia Bilis Total critical care time: 31 minutes Critical care time was exclusive of separately billable procedures and treating other patients. Critical care was necessary to treat or prevent imminent or life-threatening deterioration. Critical care was time spent personally by me on the following activities: development of treatment plan with patient and/or surrogate as well as nursing, discussions with consultants, evaluation of patient's response to treatment, examination of patient, obtaining history from patient or surrogate, ordering and performing treatments and interventions, ordering and review of laboratory studies, ordering and review of radiographic studies, pulse oximetry and re-evaluation of patient's condition.   Medications Ordered in ED Medications  atorvastatin (LIPITOR) tablet 80 mg (has no administration in time range)   stroke: mapping our early stages of recovery book (has no administration in time  range)  acetaminophen (TYLENOL) tablet 650 mg (has no administration in time range)    Or  acetaminophen (TYLENOL) solution 650 mg (has no administration in time range)    Or  acetaminophen (TYLENOL) suppository 650 mg (has no administration in time range)  senna-docusate (Senokot-S) tablet 1 tablet (has no administration in time range)  enoxaparin (LOVENOX) injection 30 mg (has no administration in time range)  0.9 %  sodium chloride infusion ( Intravenous New Bag/Given 08/18/18 1529)  insulin aspart (novoLOG) injection 0-15 Units (has no administration in time range)  insulin aspart (novoLOG) injection 0-5 Units (has no administration in time range)  aspirin tablet 325 mg (has no administration in time range)     Initial Impression / Assessment and Plan / ED Course  I have reviewed the triage vital signs and the nursing notes.  Pertinent labs & imaging results that were available during my care of the patient were reviewed by me and considered in my medical decision making (see chart for details).     Acute stroke.  Patient is not a candidate for TPA secondary to downtime.  Acute kidney injury likely secondary to dehydration.  IV fluids given.  CK laboratory values pending at this time.  Case discussed with neurology who will see the patient in consultation.  Tried hospitalist admission.  I discussed the case with the patient's family member who is driving down from [place] at this time.  This was her sister.  All questions answered.  Ongoing close evaluation in the emergency department while awaiting a bed upstairs.  Consultation; [facility] Dr Holli Humbles MD  Final Clinical Impressions(s) / ED Diagnoses   Final diagnoses:  Acute CVA (cerebrovascular accident) [facility])    ED Discharge Orders    None       Azalia Bilis, MD 08/18/18 1715

## 2018-08-19 ENCOUNTER — Inpatient Hospital Stay (HOSPITAL_COMMUNITY): Payer: BC Managed Care – PPO

## 2018-08-19 DIAGNOSIS — I639 Cerebral infarction, unspecified: Secondary | ICD-10-CM

## 2018-08-19 DIAGNOSIS — I6300 Cerebral infarction due to thrombosis of unspecified precerebral artery: Secondary | ICD-10-CM

## 2018-08-19 LAB — COMPREHENSIVE METABOLIC PANEL
ALBUMIN: 3 g/dL — AB (ref 3.5–5.0)
ALT: 49 U/L — ABNORMAL HIGH (ref 0–44)
AST: 35 U/L (ref 15–41)
Alkaline Phosphatase: 63 U/L (ref 38–126)
Anion gap: 9 (ref 5–15)
BUN: 55 mg/dL — AB (ref 8–23)
CHLORIDE: 114 mmol/L — AB (ref 98–111)
CO2: 20 mmol/L — ABNORMAL LOW (ref 22–32)
Calcium: 8.1 mg/dL — ABNORMAL LOW (ref 8.9–10.3)
Creatinine, Ser: 1.69 mg/dL — ABNORMAL HIGH (ref 0.44–1.00)
GFR calc Af Amer: 37 mL/min — ABNORMAL LOW (ref >60)
GFR calc non Af Amer: 32 mL/min — ABNORMAL LOW (ref >60)
GLUCOSE: 110 mg/dL — AB (ref 70–99)
Potassium: 3.8 mmol/L (ref 3.5–5.1)
SODIUM: 143 mmol/L (ref 135–145)
Total Bilirubin: 1.1 mg/dL (ref 0.3–1.2)
Total Protein: 5.8 g/dL — ABNORMAL LOW (ref 6.5–8.1)

## 2018-08-19 LAB — URINALYSIS, ROUTINE W REFLEX MICROSCOPIC
Bacteria, UA: NONE SEEN
Bilirubin Urine: NEGATIVE
Glucose, UA: NEGATIVE mg/dL
Hgb urine dipstick: NEGATIVE
Ketones, ur: 5 mg/dL — AB
Nitrite: NEGATIVE
Protein, ur: NEGATIVE mg/dL
Specific Gravity, Urine: 1.026 (ref 1.005–1.030)
pH: 5 (ref 5.0–8.0)

## 2018-08-19 LAB — CK: Total CK: 894 U/L — ABNORMAL HIGH (ref 38–234)

## 2018-08-19 LAB — LIPID PANEL
Cholesterol: 153 mg/dL (ref 0–200)
HDL: 49 mg/dL (ref >40)
LDL Cholesterol: 75 mg/dL (ref 0–99)
Total CHOL/HDL Ratio: 3.1 RATIO
Triglycerides: 143 mg/dL (ref <150)
VLDL: 29 mg/dL (ref 0–40)

## 2018-08-19 LAB — RAPID URINE DRUG SCREEN, HOSP PERFORMED
Amphetamines: NOT DETECTED
Barbiturates: NOT DETECTED
Benzodiazepines: NOT DETECTED
Cocaine: NOT DETECTED
Opiates: NOT DETECTED
Tetrahydrocannabinol: NOT DETECTED

## 2018-08-19 LAB — HEMOGLOBIN A1C
Hgb A1c MFr Bld: 6.6 % — ABNORMAL HIGH (ref 4.8–5.6)
Mean Plasma Glucose: 142.72 mg/dL

## 2018-08-19 LAB — GLUCOSE, CAPILLARY
Glucose-Capillary: 89 mg/dL (ref 70–99)
Glucose-Capillary: 134 mg/dL — ABNORMAL HIGH (ref 70–99)
Glucose-Capillary: 142 mg/dL — ABNORMAL HIGH (ref 70–99)
Glucose-Capillary: 189 mg/dL — ABNORMAL HIGH (ref 70–99)

## 2018-08-19 LAB — HIV ANTIBODY (ROUTINE TESTING W REFLEX): HIV Screen 4th Generation wRfx: NONREACTIVE

## 2018-08-19 LAB — ECHOCARDIOGRAM COMPLETE
Height: 69 in
Weight: 3015.89 oz

## 2018-08-19 MED ORDER — PNEUMOCOCCAL VAC POLYVALENT 25 MCG/0.5ML IJ INJ
0.5000 mL | INJECTION | INTRAMUSCULAR | Status: AC
  Administered 2018-08-20: 0.5 mL via INTRAMUSCULAR
  Filled 2018-08-19 (×2): qty 0.5

## 2018-08-19 MED ORDER — ATORVASTATIN CALCIUM 40 MG PO TABS
40.0000 mg | ORAL_TABLET | Freq: Every day | ORAL | Status: DC
  Administered 2018-08-20 – 2018-08-26 (×7): 40 mg via ORAL
  Filled 2018-08-19 (×7): qty 1

## 2018-08-19 MED ORDER — AMLODIPINE BESYLATE 5 MG PO TABS
5.0000 mg | ORAL_TABLET | Freq: Every day | ORAL | Status: DC
  Administered 2018-08-19 – 2018-08-25 (×7): 5 mg via ORAL
  Filled 2018-08-19 (×7): qty 1

## 2018-08-19 NOTE — Evaluation (Signed)
Occupational Therapy Evaluation Patient Details Name: Catherine Boyd MRN: [id number] DOB: [birth year] Today's Date: 08/19/2018    History of Present Illness pt is a 62 y/o female with PMH significant for HTN, DM, presenting to ED after having been found down at home, having been down all weekend.  MRI showed Acute/early subacute infart within the L BG and corona radiate pluc additional puctate focus in left anterior insula.  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   Clinical Impression   This 62 y/o female presents with the above. At baseline pt is independent with ADLs, iADLs and functional mobility. Pt presents sitting up in recliner pleasant and willing to participate in therapy session. presenting with RUE weakness, cognitive impairments, and decreased sitting/standing balance impacting her functional performance. Pt currently requiring maxA for sit<>stand from recliner and modA to maintain static standing very briefly. She requires modA for UB ADL, max-totalA for LB ADL; requiring increased time, repetition and multimodal cues for following simple commands. Pt will benefit from continued acute OT services, and feel she will benefit from CIR level services at time of discharge (pending availability of support/assist after discharge home) to maximize her overall safety and independence with ADLs and mobility. Will follow.     Follow Up Recommendations  CIR;Supervision/Assistance - 24 hour(pending pt able to have 24hr assist at time of d/c)    Equipment Recommendations  3 in 1 bedside commode;Other (comment)(TBD in next venue)           Precautions / Restrictions Precautions Precautions: Fall Restrictions Weight Bearing Restrictions: No      Mobility Bed Mobility               General bed mobility comments: OOB in recliner upon arrival  Transfers Overall transfer level: Needs assistance Equipment used: 1 person hand held assist Transfers: Sit to/from  Stand Sit to Stand: Max assist         General transfer comment: assist for anterior wt shift and to rise and steady from recliner; pt only able to maintain static standing for brief perior prior to initiating return to sitting; pt requires max cues/assist to scoot hips towards edge of recliner prior to stand    Balance Overall balance assessment: Needs assistance Sitting-balance support: Feet supported;Single extremity supported Sitting balance-Leahy Scale: Poor Sitting balance - Comments: pt noted with L lateral lean while in recliner Postural control: Left lateral lean Standing balance support: Single extremity supported Standing balance-Leahy Scale: Poor Standing balance comment: reliant on external assist for standing balance                           ADL either performed or assessed with clinical judgement   ADL Overall ADL's : Needs assistance/impaired Eating/Feeding: Set up;Minimal assistance;Sitting   Grooming: Minimal assistance;Sitting   Upper Body Bathing: Moderate assistance;Sitting   Lower Body Bathing: Maximal assistance;Sit to/from stand   Upper Body Dressing : Moderate assistance;Sitting   Lower Body Dressing: Maximal assistance;Sit to/from stand               Functional mobility during ADLs: Maximal assistance(sit<>stand only this session) General ADL Comments: pt with decreased RUE use, cognitive impairments, and fatigue with activity     Vision Baseline Vision/History: Wears glasses(doesn't have them with her) Wears Glasses: At all times Patient Visual Report: No change from baseline Vision Assessment?: Yes Eye Alignment: Within Functional Limits Ocular Range of Motion: Within Functional Limits Alignment/Gaze Preference: Within Defined  Limits Tracking/Visual Pursuits: Unable to hold eye position out of midline;Impaired - to be further tested in functional context;Decreased smoothness of vertical tracking;Decreased smoothness of eye  movement to RIGHT superior field;Decreased smoothness of eye movement to RIGHT inferior field Additional Comments: questionable R inattention     Perception     Praxis      Pertinent Vitals/Pain Pain Assessment: No/denies pain     Hand Dominance Right   Extremity/Trunk Assessment Upper Extremity Assessment Upper Extremity Assessment: RUE deficits/detail;Generalized weakness(gross weakness in LUE) RUE Deficits / Details: grossly 1/5 strength, minimal shoulder shrug noted, increased tone noted during PROM RUE Sensation: (will further assess; pt with fluctuating responses) RUE Coordination: decreased fine motor;decreased gross motor   Lower Extremity Assessment Lower Extremity Assessment: Defer to PT evaluation RLE Deficits / Details: moves in synergy, some isolated movement,,  3+ gross extension , 3- hip flexors, quads,  RLE Sensation: (per pt WNL) RLE Coordination: decreased fine motor LLE Deficits / Details: mildly weak,  LLE Sensation: WNL LLE Coordination: WNL       Communication Communication Communication: Other (comment)(intelligible , but with expressive difficulties)   Cognition Arousal/Alertness: Awake/alert Behavior During Therapy: WFL for tasks assessed/performed;Flat affect Overall Cognitive Status: Impaired/Different from baseline Area of Impairment: Attention;Following commands;Awareness;Problem solving                   Current Attention Level: Sustained   Following Commands: Follows one step commands inconsistently;Follows one step commands with increased time     Problem Solving: Slow processing;Decreased initiation;Requires verbal cues;Requires tactile cues General Comments: pt easily distracted with family arriving during session; requires multimodal cues   General Comments       Exercises     Shoulder Instructions      Home Living Family/patient expects to be discharged to:: Private residence Living Arrangements: Alone Available Help  at Discharge: Family;Friend(s);Available PRN/intermittently(reports she may stay with sister after discharge initially) Type of Home: House Home Access: Stairs to enter Enter[employer] CorporationEntrance Stairs-Number of Steps: 2 Entrance Stairs-Rails: Right;Left Home Layout: Two level Alternate Level Stairs-Number of Steps: flight Alternate Level Stairs-Rails: Right;Left Bathroom Shower/Tub: Chief Strategy OfficerTub/shower unit   Bathroom Toilet: Standard     Home Equipment: None      Lives With: Alone    Prior Functioning/Environment Level of Independence: Independent        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        OT Problem List: Decreased strength;Decreased range of motion;Decreased activity tolerance;Impaired balance (sitting and/or standing);Impaired vision/perception;Decreased cognition;Decreased safety awareness;Impaired UE functional use      OT Treatment/Interventions: Self-care/ADL training;Therapeutic exercise;Neuromuscular education;DME and/or AE instruction;Therapeutic activities;Patient/family education;Balance training    OT Goals(Current goals can be found in the care plan section) Acute Rehab OT Goals Patient Stated Goal: none stated this session OT Goal Formulation: With patient Time For Goal Achievement: 09/02/18 Potential to Achieve Goals: Good  OT Frequency: Min 2X/week   Barriers to D/C:            Co-evaluation              AM-PAC OT "6 Clicks" Daily Activity     Outcome Measure Help from another person eating meals?: A Lot Help from another person taking care of personal grooming?: A Lot Help from another person toileting, which includes using toliet, bedpan, or urinal?: Total Help from another person bathing (including washing, rinsing, drying)?: A Lot Help from another person to put on and taking off regular upper body clothing?: A  Lot Help from another person to put on and taking off regular lower body clothing?: Total 6 Click Score: 10   End of  Session Equipment Utilized During Treatment: Gait belt Nurse Communication: Mobility status  Activity Tolerance: Patient tolerated treatment well Patient left: in chair;with call bell/phone within reach;with chair alarm set;with family/visitor present  OT Visit Diagnosis: Muscle weakness (generalized) (M62.81);Hemiplegia and hemiparesis Hemiplegia - Right/Left: Right Hemiplegia - dominant/non-dominant: Dominant Hemiplegia - caused by: Cerebral infarction                Time: 1610-9604 OT Time Calculation (min): 22 min Charges:  OT General Charges $OT Visit: 1 Visit OT Evaluation $OT Eval Moderate Complexity: 1 Mod  Marcy Siren, OT [employer] Pager [phone] Office [phone]   Orlando Penner 08/19/2018, 3:38 PM

## 2018-08-19 NOTE — Progress Notes (Signed)
  Echocardiogram 2D Echocardiogram has been performed.  Janalyn HarderWest, Esly Selvage R 08/19/2018, 12:17 PM

## 2018-08-19 NOTE — Evaluation (Addendum)
Physical Therapy Evaluation Patient Details Name: Catherine Boyd MRN: [id number] DOB: May 21, 1956 Today's Date: 08/19/2018   History of Present Illness  pt is a 62 y/o female with PMH significant for HTN, DM, presenting to ED after having been found down at home, having been down all weekend.  MRI showed Acute/early subacute infart within the L BG and corona radiate pluc additional puctate focus in left anterior insula.  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  Clinical Impression  Pt admitted with/for s/s of stroke with R sided weakness.  Pt presently needing mod to max assist for mobility and has not ambulated at this time.  Pt currently limited functionally due to the problems listed. ( See problems list.)   Pt will benefit from PT to maximize function and safety in order to get ready for next venue listed below. I have discussed the patient's current level of function related to the stroke with the patient and they acknowledge understanding that the therapist does not feel that the patient will be able to manage at home immediately on their own.  They are interested in post-acute rehab in an inpatient setting.      Follow Up Recommendations CIR;Supervision/Assistance - 24 hour    Equipment Recommendations  Other (comment)(TBA)    Recommendations for Other Services Rehab consult     Precautions / Restrictions Precautions Precautions: Fall Restrictions Weight Bearing Restrictions: No      Mobility  Bed Mobility Overal bed mobility: Needs Assistance Bed Mobility: Supine to Sit     Supine to sit: Mod assist     General bed mobility comments: OOB in recliner upon arrival  Transfers Overall transfer level: Needs assistance Equipment used: 1 person hand held assist Transfers: Sit to/from Stand Sit to Stand: Max assist Stand pivot transfers: Max assist;Mod assist       General transfer comment: assist for anterior wt shift and to rise and steady from  recliner; pt only able to maintain static standing for brief perior prior to initiating return to sitting; pt requires max cues/assist to scoot hips towards edge of recliner prior to stand  Ambulation/Gait                Stairs            Wheelchair Mobility    Modified Rankin (Stroke Patients Only) Modified Rankin (Stroke Patients Only) Pre-Morbid Rankin Score: No symptoms Modified Rankin: Moderately severe disability     Balance Overall balance assessment: Needs assistance Sitting-balance support: Feet supported;Single extremity supported Sitting balance-Leahy Scale: Poor Sitting balance - Comments: pt noted with L lateral lean while in recliner Postural control: Left lateral lean Standing balance support: Single extremity supported Standing balance-Leahy Scale: Poor Standing balance comment: reliant on external assist for standing balance                             Pertinent Vitals/Pain Pain Assessment: No/denies pain    Home Living Family/patient expects to be discharged to:: Private residence Living Arrangements: Alone Available Help at Discharge: Family;Friend(s);Available PRN/intermittently(reports she may stay with sister after discharge initially) Type of Home: House Home Access: Stairs to enter Entrance Stairs-Rails: Doctor, general practice of Steps: 2 Home Layout: Two level Home Equipment: None      Prior Function Level of Independence: Independent         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     Hand Dominance  Dominant Hand: Right    Extremity/Trunk Assessment   Upper Extremity Assessment Upper Extremity Assessment: RUE deficits/detail;Generalized weakness(gross weakness in LUE) RUE Deficits / Details: grossly 1/5 strength, minimal shoulder shrug noted, increased tone noted during PROM RUE Sensation: (will further assess; pt with fluctuating responses) RUE Coordination: decreased fine  motor;decreased gross motor    Lower Extremity Assessment Lower Extremity Assessment: Defer to PT evaluation RLE Deficits / Details: moves in synergy, some isolated movement,,  3+ gross extension , 3- hip flexors, quads,  RLE Sensation: (per pt WNL) RLE Coordination: decreased fine motor LLE Deficits / Details: mildly weak,  LLE Sensation: WNL LLE Coordination: WNL       Communication   Communication: Other (comment)(intelligible , but with expressive difficulties)  Cognition Arousal/Alertness: Awake/alert Behavior During Therapy: WFL for tasks assessed/performed;Flat affect Overall Cognitive Status: Impaired/Different from baseline Area of Impairment: Attention;Following commands;Awareness;Problem solving                   Current Attention Level: Sustained   Following Commands: Follows one step commands inconsistently;Follows one step commands with increased time     Problem Solving: Slow processing;Decreased initiation;Requires verbal cues;Requires tactile cues General Comments: pt easily distracted with family arriving during session; requires multimodal cues      General Comments      Exercises     Assessment/Plan    PT Assessment Patient needs continued PT services  PT Problem List Decreased strength;Decreased activity tolerance;Decreased balance;Decreased mobility;Decreased coordination;Decreased knowledge of use of DME       PT Treatment Interventions DME instruction;Gait training;Functional mobility training;Therapeutic activities;Balance training;Neuromuscular re-education;Patient/family education    PT Goals (Current goals can be found in the Care Plan section)  Acute Rehab PT Goals Patient Stated Goal: none stated this session PT Goal Formulation: With patient Time For Goal Achievement: 09/02/18 Potential to Achieve Goals: Good    Frequency     Barriers to discharge        Co-evaluation               AM-PAC PT "6 Clicks" Mobility   Outcome Measure Help needed turning from your back to your side while in a flat bed without using bedrails?: A Lot Help needed moving from lying on your back to sitting on the side of a flat bed without using bedrails?: A Lot Help needed moving to and from a bed to a chair (including a wheelchair)?: A Lot Help needed standing up from a chair using your arms (e.g., wheelchair or bedside chair)?: A Lot Help needed to walk in hospital room?: A Lot Help needed climbing 3-5 steps with a railing? : Total 6 Click Score: 11    End of Session   Activity Tolerance: Patient tolerated treatment well Patient left: in chair;with call bell/phone within reach;with chair alarm set Nurse Communication: Mobility status PT Visit Diagnosis: Unsteadiness on feet (R26.81);Hemiplegia and hemiparesis;Other abnormalities of gait and mobility (R26.89) Hemiplegia - Right/Left: Right Hemiplegia - dominant/non-dominant: Dominant Hemiplegia - caused by: Cerebral infarction    Time: [id number] PT Time Calculation (min) (ACUTE ONLY): 37 min   Charges:   PT Evaluation $PT Eval Moderate Complexity: 1 Mod PT Treatments $Therapeutic Activity: 8-22 mins        08/19/2018  [place] BingKen Rashema Seawright, PT Acute Rehabilitation Services [phone]  (pager) [phone]  (office)  Eliseo GumKenneth V Japji Kok 08/19/2018, 5:13 PM

## 2018-08-19 NOTE — Progress Notes (Addendum)
STROKE TEAM PROGRESS NOTE   INTERVAL HISTORY Her NT is at the bedside.  No family present. She recounted HPI in detail  Vitals:   08/18/18 2359 08/19/18 0325 08/19/18 0353 08/19/18 0737  BP: 111/79  126/72 (!) 155/79  Pulse: 95  91 93  Resp:   17 18  Temp: 98.6 F (37 C)  (!) 97.5 F (36.4 C) 98.4 F (36.9 C)  TempSrc: Oral  Oral Oral  SpO2: 100%  100% 100%  Weight:      Height:  5\' 9"  (1.753 m)      CBC:  Recent Labs  Lab 08/18/18 1051 08/18/18 1111  WBC  --  16.1*  NEUTROABS  --  13.4*  HGB 15.0 13.6  HCT 44.0 43.9  MCV  --  84.4  PLT  --  276    Basic Metabolic Panel:  Recent Labs  Lab 08/18/18 1111 08/19/18 0739  NA 141 143  K 4.2 3.8  CL 103 114*  CO2 25 20*  GLUCOSE 190* 110*  BUN 61* 55*  CREATININE 1.88* 1.69*  CALCIUM 9.1 8.1*   Lipid Panel:     Component Value Date/Time   CHOL 153 08/19/2018 0739   TRIG 143 08/19/2018 0739   HDL 49 08/19/2018 0739   CHOLHDL 3.1 08/19/2018 0739   VLDL 29 08/19/2018 0739   LDLCALC 75 08/19/2018 0739   HgbA1c:  Lab Results  Component Value Date   HGBA1C 6.6 (H) 08/19/2018   Urine Drug Screen: No results found for: LABOPIA, COCAINSCRNUR, LABBENZ, AMPHETMU, THCU, LABBARB  Alcohol Level     Component Value Date/Time   ETH <10 08/18/2018 1111    IMAGING Ct Head Wo Contrast  Result Date: 08/18/2018 CLINICAL DATA:  62 year old female found down.  Initial encounter. EXAM: CT HEAD WITHOUT CONTRAST TECHNIQUE: Contiguous axial images were obtained from the base of the skull through the vertex without intravenous contrast. COMPARISON:  None. FINDINGS: Brain: Acute nonhemorrhagic infarct suspected involving the left corona radiata/lenticular nucleus. Remote small anteromedial left thalamic infarct. Prominent chronic microvascular changes. No intracranial hemorrhage. Global atrophy. Slight asymmetry lateral ventricles without mass seen causing this appearance. The ventricular size is most likely related to atrophy  rather hydrocephalus. Aqueduct is patent. No intracranial mass lesion noted on this unenhanced exam. Vascular: Vascular calcifications. Intracranial vasculature is ectatic with calcified plaque cavernous segment internal carotid arteries and portion of vertebral arteries. Skull: No skull fracture Sinuses/Orbits: No acute orbital abnormality. Visualized paranasal sinuses are clear. Other: Mastoid air cells and middle ear cavities are clear. IMPRESSION: 1. Acute nonhemorrhagic infarct suspected involving the left corona radiata/lenticular nucleus. 2. Remote small anteromedial left thalamic infarct. 3. Prominent chronic microvascular changes. 4. No intracranial hemorrhage. 5. Global atrophy. These results were called by telephone at the time of interpretation on 08/18/2018 at 12:31 pm to Dr. Azalia Bilis , who verbally acknowledged these results. Electronically Signed   By: Lacy Duverney M.D.   On: 08/18/2018 12:40   Mr Brain Wo Contrast  Result Date: 08/18/2018 CLINICAL DATA:  62 y/o  F; found down.  Stroke follow-up. EXAM: MRI HEAD WITHOUT CONTRAST MRA HEAD WITHOUT CONTRAST TECHNIQUE: Multiplanar, multiecho pulse sequences of the brain and surrounding structures were obtained without intravenous contrast. Angiographic images of the head were obtained using MRA technique without contrast. COMPARISON:  08/18/2018 CT head. FINDINGS: MRI HEAD FINDINGS Brain: Focus of reduced diffusion of left putamen, corona radiata, and caudate body measuring 2.5 x 1.2 x 2.5 cm (volume = 3.9 cm^3) (AP  x ML x CC series 3, image 38 and series 5, image 17) compatible with acute/early subacute infarction. Additional punctate focus of reduced diffusion within the right anterior insula. 12 mm focus of intermediate diffusion within the right brachium pontis compatible with subacute infarct (series 3, image 19). Patchy comp nonspecific T2 FLAIR hyperintensities in subcortical and periventricular white matter as well as the pons are  compatible with advanced chronic microvascular ischemic changes for age. Moderate to severe volume loss of the brain. 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 Punctate foci of susceptibility hypointensity compatible with hemosiderin deposition of chronic microhemorrhage are present scattered throughout the brain in a predominantly central distribution. Vascular: As below. Skull and upper cervical spine: Normal marrow signal. Sinuses/Orbits: Negative. Other: None. MRA HEAD FINDINGS Internal carotid arteries:  Patent. Anterior cerebral arteries:  Patent. Middle cerebral arteries: Patent. Anterior communicating artery: Not identified, likely hypoplastic or absent. Posterior communicating arteries:  Patent.  Fetal left PCA. Posterior cerebral arteries:  Patent. Basilar artery:  Patent. Vertebral arteries: Patent. Left dominant. Right vertebral artery terminates in right PICA. Persistent right trigeminal artery variant anatomy. No evidence of high-grade stenosis, large vessel occlusion, or aneurysm. IMPRESSION: MRI head: 1. 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 No associated hemorrhage or mass effect. Additional punctate focus in left anterior insula. 2. 12 mm subacute infarction within right brachium pontis. 3. Advanced chronic microvascular ischemic changes and moderate volume loss of the brain. 4. Multiple foci of chronic microhemorrhage with predominantly central distribution favoring sequelae of chronic hypertension. 5. Left paramedian frontoparietal region arachnoid cyst. MRA head: 1. No large vessel occlusion, aneurysm, or significant stenosis is identified. 2. Incidental persistent right trigeminal artery noted. These results will be called to the ordering clinician or representative by the  Radiologist Assistant, and communication documented in the PACS or zVision Dashboard. Electronically Signed   By: Mitzi Hansen M.D.   On: 08/18/2018 19:44   Mr Maxine Glenn Head Wo Contrast  Result Date: 08/18/2018 CLINICAL DATA:  62 y/o  F; found down.  Stroke follow-up. EXAM: MRI HEAD WITHOUT CONTRAST MRA HEAD WITHOUT CONTRAST TECHNIQUE: Multiplanar, multiecho pulse sequences of the brain and surrounding structures were obtained without intravenous contrast. Angiographic images of the head were obtained using MRA technique without contrast. COMPARISON:  08/18/2018 CT head. FINDINGS: MRI HEAD FINDINGS Brain: Focus of reduced diffusion of left putamen, corona radiata, and caudate body measuring 2.5 x 1.2 x 2.5 cm (volume = 3.9 cm^3) (AP x ML x CC series 3, image 38 and series 5, image 17) compatible with acute/early subacute infarction. Additional punctate focus of reduced diffusion within the right anterior insula. 12 mm focus of intermediate diffusion within the right brachium pontis compatible with subacute infarct (series 3, image 19). Patchy comp nonspecific T2 FLAIR hyperintensities in subcortical and periventricular white matter as well as the pons are compatible with advanced chronic microvascular ischemic changes for age. Moderate to severe volume loss of the brain. 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 Punctate foci of susceptibility hypointensity compatible with hemosiderin deposition of chronic microhemorrhage are present scattered throughout the brain in a predominantly central distribution. Vascular: As  below. Skull and upper cervical spine: Normal marrow signal. Sinuses/Orbits: Negative. Other: None. MRA HEAD FINDINGS Internal carotid arteries:  Patent. Anterior cerebral arteries:  Patent. Middle cerebral arteries: Patent. Anterior communicating  artery: Not identified, likely hypoplastic or absent. Posterior communicating arteries:  Patent.  Fetal left PCA. Posterior cerebral arteries:  Patent. Basilar artery:  Patent. Vertebral arteries: Patent. Left dominant. Right vertebral artery terminates in right PICA. Persistent right trigeminal artery variant anatomy. No evidence of high-grade stenosis, large vessel occlusion, or aneurysm. IMPRESSION: MRI head: 1. 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 No associated hemorrhage or mass effect. Additional punctate focus in left anterior insula. 2. 12 mm subacute infarction within right brachium pontis. 3. Advanced chronic microvascular ischemic changes and moderate volume loss of the brain. 4. Multiple foci of chronic microhemorrhage with predominantly central distribution favoring sequelae of chronic hypertension. 5. Left paramedian frontoparietal region arachnoid cyst. MRA head: 1. No large vessel occlusion, aneurysm, or significant stenosis is identified. 2. Incidental persistent right trigeminal artery noted. These results will be called to the ordering clinician or representative by the Radiologist Assistant, and communication documented in the PACS or zVision Dashboard. Electronically Signed   By: Mitzi Hansen M.D.   On: 08/18/2018 19:44    PHYSICAL EXAM Pleasant middle aged lady not in distress. . Afebrile. Head is nontraumatic. Neck is supple without bruit.    Cardiac exam no murmur or gallop. Lungs are clear to auscultation. Distal pulses are well felt. Neurological Exam :  Awake alert oriented 3. Severe dysarthria but can be understood. No aphasia. Follows commands well. Extraocular movements are full range without nystagmus. She blinks to threat bilaterally. Moderate right lower facial weakness. Tongue midline. Right hemiplegia with right upper extremity 2/5 strength in right lower extremity 4/5 strength with  drift. Normal strength on the left. Sensation is intact. Reflexes are depressed on the right normal on the left. Right plantar equivocal left downgoing. Gait not tested. ASSESSMENT/PLAN Ms. Catherine Boyd is a 62 y.o. female with history of HTN and DB presenting with L sided weakness, found down x 2 days.   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 Old L thalamic infarct. Prominent SVD. Atrophy.  MRI  Acute/subactue L basal ganglia and corona radiata infarct. Also small L anterior insular infarct. Subacute R brachium pontis infarct. Small vessel disease. Atrophy. Multiple foci of chronic micro hemorrhages . L paramedian FP arachnoid cyst.  MRA  No ELVO. Incidental persistent R trigeminal artery.  Carotid Doppler  B ICA 1-39% stenosis, VAs antegrade   2D Echo  pending   TEE to look for embolic source. Arranged with [facility] for tomorrow.  If positive for PFO (patent foramen ovale), check bilateral lower extremity venous dopplers to rule out DVT as possible source of stroke. (I have made patient NPO after midnight tonight).  If TEE negative, a [facility] electrophysiologist will consult and consider placement of an implantable loop recorder to evaluate for atrial fibrillation as etiology of stroke. This has been explained to patient/family by Dr. Pearlean Brownie and they are agreeable.   LDL 75  HgbA1c 6.6  Lovenox 30 mg sq daily for VTE prophylaxis  No antithrombotic prior to admission, now on aspirin 325 mg daily and clopidogrel 75 mg daily following aspirin and plavix load. Will continue aspirin alone.  No need for additional plavix given NIHSS > 4  Therapy recommendations:  pending   Disposition:  pending   Hypertension  Stable . Permissive hypertension (OK if < 220/120) but gradually normalize in 5-7 days . Long-term BP goal  normotensive  Hyperlipidemia  Home meds:  lipitor 10  Increased to lipitor 80 on admission to hospital  LDL 75, goal < 70  Decrease lipitor to 40  Continue statin` at discharge  Diabetes type II  HgbA1c 6.6, goal < 7.0  Controlled  Other Stroke Risk Factors  ETOH use, advised to drink no more than 1 drink(s) a day  Family hx stroke (mother)  Other Active Problems  AKI on CKD III  Leukocytosis 16.1  Hospital day # 1  Annie MainSharon Biby, MSN, APRN, ANVP-BC, AGPCNP-BC Advanced Practice Stroke Nurse [facility] See Amion for Schedule & Pager information 08/19/2018 11:33 AM  I have personally examined this patient, reviewed notes, independently viewed imaging studies, participated in medical decision making and plan of care.ROS completed by me personally and pertinent positives fully documented  I have made any additions or clarifications directly to the above note. Agree with note above. She presented more than 2 days after onset of her stroke and has large left basal ganglia as well as smaller brainstem infarcts in etiology likely embolic. Continue aspirin.325mg  daily and ongoing stroke workup. Recommend check TEE and loop recorder.discussed with Dr. Mahala MenghiniSamtani. Greater than 50% time during this  35 minute visit was spent on counseling and coordination of care about her embolic stroke and answered questions.  Delia HeadyPramod Jansen Goodpasture, MD Medical Director [facility] Pager: [phone] 08/19/2018 3:08 PM  To contact Stroke Continuity provider, please refer to [email or web address]. After hours, contact General Neurology

## 2018-08-19 NOTE — Evaluation (Addendum)
Speech Language Pathology Evaluation Patient Details Name: Catherine Boyd MRN: [id number] DOB: March 25, 1956 Today's Date: 08/19/2018 Time: 4098-1191 SLP Time Calculation (min) (ACUTE ONLY): 34 min  Problem List:  Patient Active Problem List   Diagnosis Date Noted  . Chronic kidney disease, stage III (moderate) (HCC) 08/18/2018  . Diabetes mellitus (HCC) 08/18/2018  . Hypertension 08/18/2018  . CVA (cerebral vascular accident) (HCC) 08/18/2018  . AKI (acute kidney injury) (HCC) 08/18/2018   Past Medical History:  Past Medical History:  Diagnosis Date  . Diabetes 1.5, managed as type 2 (HCC)   . Diabetes mellitus without complication (HCC)   . Hypertension    Past Surgical History: History reviewed. No pertinent surgical history. HPI:   62 y.o. female with medical history significant for hypertension, diabetes not on insulin, who was brought to the ED on 08/18/18 by EMS after being found down at home.  She states that she went to bed on Thursday night and felt normal, woke up in the morning on Friday, went to use the restroom and when she came back to get in bed she states that she was unable to move and fell to the floor.  She denies any headache, lightheadedness, confusion or pain preceding this.  She cannot say why she was unable to get off the floor.  She states that she did not feel weak but she just felt "struck down" and unable to get up.  She is a professor at [employer] and her colleagues realized she had not come to work and became worried about her and 1 of them found her on the floor of her home on 08/08/18.  She was awake and alert.  Patient states that she was alert the whole weekend but unable to move.  Currently she has no complaints.  She takes glipizide for her diabetes and Cozaar for hypertension.  She states that her blood pressure is usually controlled with systolics in the 120s to 130s.  She does not take any aspirin or other blood thinners.  She states that both her father  and mother had strokes.  She does not smoke.   MRI brain revealed on 08/08/18 Acute/early subacute infarction within the left basal ganglia and corona radiata measuring up to 2.5 cm, 3.9 cc. No associated hemorrhage or mass effect. Additional punctate focus in left anterior insula. 2. 12 mm subacute infarction within right brachium pontis. 3. Advanced chronic microvascular ischemic changes and moderate volume loss of the brain.  Assessment / Plan / Recommendation Clinical Impression    Pt administered the MOCA [facility] Cognitive Assessment) which yielded a score of 14/25 with the graphic expression portion not administered d/t dominant hand being affected by CVA.  Pt with decreased awareness of deficits and deficits found within memory (short-term, working and decreased new info), attention (sustained), language (repetition, word fluency) and verbal expression.  Pt demonstrated moderate dysarthria with 25-50% accuracy within words- conversation.  Expressive aphasia noted during conversational tasks with perseverations noted, phonemic paraphasias, and neologisms scattered within speaking.  Recommend CIR and ST will follow acutely while in house.   SLP Assessment  SLP Recommendation/Assessment: Patient needs continued Speech Language Pathology Services SLP Visit Diagnosis: Cognitive communication deficit (R41.841);Aphasia (R47.01);Dysarthria and anarthria (R47.1)    Follow Up Recommendations  Inpatient Rehab    Frequency and Duration min 2x/week  1 week      SLP Evaluation Cognition  Overall Cognitive Status: Impaired/Different from baseline Arousal/Alertness: Awake/alert Orientation Level: Oriented X4 Attention: Sustained Sustained Attention: Impaired  Sustained Attention Impairment: Verbal basic;Functional basic Memory: Impaired Memory Impairment: Retrieval deficit;Decreased recall of new information;Decreased short term memory Decreased Short Term Memory: Verbal basic;Functional  basic Awareness: Impaired Awareness Impairment: Intellectual impairment;Anticipatory impairment Problem Solving: Impaired Problem Solving Impairment: Verbal basic;Functional basic Executive Function: Organizing;Reasoning Reasoning: Impaired Reasoning Impairment: Verbal basic;Functional basic Organizing: Impaired Organizing Impairment: Verbal basic;Functional basic Behaviors: Perseveration;Verbal agitation Safety/Judgment: Impaired       Comprehension  Auditory Comprehension Overall Auditory Comprehension: Impaired Yes/No Questions: Within Functional Limits Commands: Impaired Multistep Basic Commands: 50-74% accurate Conversation: Complex Interfering Components: Attention;Working Radio broadcast assistantmemory EffectiveTechniques: Dietitianxtra processing time;Repetition Visual Recognition/Discrimination Discrimination: Not tested Reading Comprehension Reading Status: Not tested(pt did not have glasses present; able to read larger print )    Expression Expression Primary Mode of Expression: Verbal Verbal Expression Overall Verbal Expression: Impaired Initiation: No impairment Level of Generative/Spontaneous Verbalization: Conversation Repetition: Impaired Level of Impairment: Phrase level Naming: Impairment Responsive: 26-50% accurate Confrontation: Impaired Convergent: 50-74% accurate Divergent: 25-49% accurate Other Naming Comments: (perseveration; neologisms, phonemic paraphasias) Verbal Errors: Phonemic paraphasias;Neologisms;Perseveration;Aware of errors Interfering Components: Attention;Speech intelligibility Effective Techniques: Phonemic cues Non-Verbal Means of Communication: Not applicable Written Expression Dominant Hand: Right Written Expression: Unable to assess (comment)(dominant hand affected)   Oral / Motor  Oral Motor/Sensory Function Overall Oral Motor/Sensory Function: Mild impairment Facial ROM: Reduced right Facial Symmetry: Abnormal symmetry right Facial Strength: Reduced  right Facial Sensation: Within Functional Limits Lingual ROM: Reduced right Lingual Symmetry: Abnormal symmetry right Lingual Strength: Reduced Lingual Sensation: Within Functional Limits Motor Speech Overall Motor Speech: Appears within functional limits for tasks assessed Respiration: Within functional limits Phonation: Low vocal intensity Resonance: Within functional limits Articulation: Impaired Level of Impairment: Phrase Intelligibility: Intelligibility reduced Word: 25-49% accurate Phrase: 25-49% accurate Sentence: 25-49% accurate Conversation: 25-49% accurate Motor Planning: Witnin functional limits Motor Speech Errors: Not applicable Effective Techniques: Slow rate;Over-articulate;Increased vocal intensity                       Tressie StalkerPat Cashawn Yanko, M.S., CCC-SLP 08/19/2018, 12:08 PM

## 2018-08-19 NOTE — Progress Notes (Signed)
.*  Preliminary Results* Carotid artery duplex has been completed. Bilateral internal carotid arteries are 1-39%. Vertebral arteries are patent with antegrade flow.  08/19/2018 9:52 AM  Aundra MilletMegan Clare Gandyiddle

## 2018-08-19 NOTE — Progress Notes (Signed)
[facility] PROGRESS NOTE  Catherine DadLynette R Boyd WUJ:[id number] DOB: 02/29/1956 DOA: 08/18/2018 PCP: Renford DillsPolite, Ronald, MD   Narrative: 62 year old female Known history of macular edema Diabetes mellitus type 2 without retinopathy Cataract HTN Hyperlipidemia Admitted 08/18/2018 after 2 to 3 days of being found down and could not get up- CT scan showed nonhemorrhagic infarct left corona radiata lenticular nucleus small remote anterior medial left thalamic infarct she was severely dysarthric and hypertensive in the 150s heart rate in 120s Neurology saw the patient Patient also had prerenal azotemia likely from depletion from being down and unable to drink-BUN/creatinine was 65/1.7 CK total was 1154 LFTs relatively normal  A & Plan Acute coronary radiata stroke-following work-up-continue Plavix and aspirin for 3 weeks and defer to neurology further work-up-will need follow-up Mild rhabdomyolysis secondary to being found down-continue saline-repeat CK and complete metabolic panel today monitor Macular edema-it appears hypercoagulable work-up was contemplated in outpatient setting-defer to Dr. the morning-continue drops for the same HTN allow permissive hypertension to some degree-reimplement slowly medications would hold ARB starting amlodipine Diabetes mellitus type 2 type II resume Amaryl-sugars 89-1 48 awaiting A1c   DVT prophylaxis: loveneox Code Status: full  Family Communication: none  Disposition Plan: likely needs snf or Karin Goldencir   Clive Parcel, MD  [facility] Direct contact: [phone] --Via amion app OR  --[email or web address]; password [email or web address]  7PM-7AM contact night coverage as above 08/19/2018, 7:58 AM  LOS: 1 day   Consultants:  neuro  Procedures:  multiple  Antimicrobials:  none  Interval history/Subjective:  Awake alert some repetition of word [keeps saying "normally} Thick speech defect in terms of voice quality  Objective:  Vitals:  Vitals:   08/19/18 0353  08/19/18 0737  BP: 126/72 (!) 155/79  Pulse: 91 93  Resp: 17 18  Temp: (!) 97.5 F (36.4 C) 98.4 F (36.9 C)  SpO2: 100% 100%    Exam: eomi vision by direct confrontation normal Thick defect to voice Smile asymmetric Power diminised on R side-sensory inteact-RUE wekaer > RLE Reflexes slight brisk s1 s 2no m/r/g   I have personally reviewed the following:   Creat down from 1.88->1.69 CK down from 1154->894  Scheduled Meds: .  stroke: mapping our early stages of recovery book   Does not apply Once  . aspirin  325 mg Oral Daily  . atorvastatin  80 mg Oral Daily  . clopidogrel  75 mg Oral Daily  . enoxaparin (LOVENOX) injection  30 mg Subcutaneous Q24H  . insulin aspart  0-15 Units Subcutaneous TID WC  . insulin aspart  0-5 Units Subcutaneous QHS  . [START ON 08/20/2018] pneumococcal 23 valent vaccine  0.5 mL Intramuscular Tomorrow-1000   Continuous Infusions: . sodium chloride 200 mL/hr at 08/18/18 1529    Principal Problem:   CVA (cerebral vascular accident) [facility](HCC) Active Problems:   Chronic kidney disease, stage III (moderate) (HCC)   Diabetes mellitus (HCC)   Hypertension   AKI (acute kidney injury) (HCC)   LOS: 1 day

## 2018-08-19 NOTE — Progress Notes (Signed)
    [facility] has been requested to perform a transesophageal echocardiogram on Catherine Boyd for stroke.  After careful review of history and examination, the risks and benefits of transesophageal echocardiogram have been explained including risks of esophageal damage, perforation (1:10,000 risk), bleeding, pharyngeal hematoma as well as other potential complications associated with conscious sedation including aspiration, arrhythmia, respiratory failure and death. Alternatives to treatment were discussed, questions were answered. Patient is willing to proceed.  TEE - Dr. Rennis GoldenHilty 08/20/18  @ 1200 . NPO after midnight. Meds with sips.   Manson PasseyBhavinkumar Iosefa Weintraub, PA-C 08/19/2018 2:04 PM

## 2018-08-19 NOTE — Progress Notes (Signed)
Rehab Admissions Coordinator Note:  Patient was screened by Clois DupesBoyette, Yasenia Reedy Godwin for appropriateness for an Inpatient Acute Rehab Consult per SLP and OT recs.  At this time, we are recommending Inpatient Rehab consult. I will contact Dr. Mahala MenghiniSamtani for order.  Ottie GlazierBarbara Calvyn Kurtzman, RN, MSN Rehab Admissions Coordinator [phone] 08/19/2018 5:16 PM

## 2018-08-19 NOTE — Plan of Care (Signed)
  Problem: Education: Goal: Knowledge of General Education information will improve Description Including pain rating scale, medication(s)/side effects and non-pharmacologic comfort measures Outcome: Completed/Met   Problem: Health Behavior/Discharge Planning: Goal: Ability to manage health-related needs will improve Outcome: Completed/Met   Problem: Clinical Measurements: Goal: Ability to maintain clinical measurements within normal limits will improve Outcome: Completed/Met   Problem: Activity: Goal: Risk for activity intolerance will decrease Outcome: Completed/Met   Problem: Nutrition: Goal: Adequate nutrition will be maintained Outcome: Completed/Met   Problem: Coping: Goal: Level of anxiety will decrease Outcome: Completed/Met   Problem: Elimination: Goal: Will not experience complications related to bowel motility Outcome: Completed/Met   Problem: Pain Managment: Goal: General experience of comfort will improve Outcome: Completed/Met   Problem: Safety: Goal: Ability to remain free from injury will improve Outcome: Completed/Met   Problem: Skin Integrity: Goal: Risk for impaired skin integrity will decrease Outcome: Completed/Met   Problem: Education: Goal: Knowledge of disease or condition will improve Outcome: Completed/Met Goal: Knowledge of secondary prevention will improve Outcome: Completed/Met Goal: Knowledge of patient specific risk factors addressed and post discharge goals established will improve Outcome: Completed/Met Goal: Individualized Educational Video(s) Outcome: Completed/Met   Problem: Coping: Goal: Will verbalize positive feelings about self Outcome: Completed/Met   Problem: Health Behavior/Discharge Planning: Goal: Ability to manage health-related needs will improve Outcome: Completed/Met   Problem: Self-Care: Goal: Ability to participate in self-care as condition permits will improve Outcome: Completed/Met   Problem:  Nutrition: Goal: Risk of aspiration will decrease Outcome: Completed/Met   Problem: Ischemic Stroke/TIA Tissue Perfusion: Goal: Complications of ischemic stroke/TIA will be minimized Outcome: Completed/Met

## 2018-08-20 ENCOUNTER — Encounter (HOSPITAL_COMMUNITY)
Admission: EM | Disposition: A | Discharge status: Skilled Nursing Facility | Payer: Self-pay | Source: Home / Self Care | Attending: Internal Medicine

## 2018-08-20 ENCOUNTER — Inpatient Hospital Stay (HOSPITAL_COMMUNITY): Payer: BC Managed Care – PPO | Admitting: Anesthesiology

## 2018-08-20 ENCOUNTER — Encounter (HOSPITAL_COMMUNITY): Payer: Self-pay | Admitting: Anesthesiology

## 2018-08-20 ENCOUNTER — Inpatient Hospital Stay (HOSPITAL_COMMUNITY): Payer: BC Managed Care – PPO

## 2018-08-20 DIAGNOSIS — I639 Cerebral infarction, unspecified: Secondary | ICD-10-CM

## 2018-08-20 DIAGNOSIS — I6300 Cerebral infarction due to thrombosis of unspecified precerebral artery: Secondary | ICD-10-CM

## 2018-08-20 HISTORY — PX: TEE WITHOUT CARDIOVERSION: SHX5443

## 2018-08-20 LAB — COMPREHENSIVE METABOLIC PANEL
ALK PHOS: 62 U/L (ref 38–126)
ALT: 39 U/L (ref 0–44)
ANION GAP: 4 — AB (ref 5–15)
AST: 31 U/L (ref 15–41)
Albumin: 2.7 g/dL — ABNORMAL LOW (ref 3.5–5.0)
BILIRUBIN TOTAL: 0.8 mg/dL (ref 0.3–1.2)
BUN: 37 mg/dL — ABNORMAL HIGH (ref 8–23)
CALCIUM: 7.7 mg/dL — AB (ref 8.9–10.3)
CO2: 21 mmol/L — ABNORMAL LOW (ref 22–32)
Chloride: 117 mmol/L — ABNORMAL HIGH (ref 98–111)
Creatinine, Ser: 1.44 mg/dL — ABNORMAL HIGH (ref 0.44–1.00)
GFR calc Af Amer: 45 mL/min — ABNORMAL LOW (ref >60)
GFR calc non Af Amer: 39 mL/min — ABNORMAL LOW (ref >60)
Glucose, Bld: 123 mg/dL — ABNORMAL HIGH (ref 70–99)
Potassium: 3.9 mmol/L (ref 3.5–5.1)
SODIUM: 142 mmol/L (ref 135–145)
TOTAL PROTEIN: 5.4 g/dL — AB (ref 6.5–8.1)

## 2018-08-20 LAB — GLUCOSE, CAPILLARY
Glucose-Capillary: 119 mg/dL — ABNORMAL HIGH (ref 70–99)
Glucose-Capillary: 98 mg/dL (ref 70–99)
Glucose-Capillary: 168 mg/dL — ABNORMAL HIGH (ref 70–99)

## 2018-08-20 LAB — CK: Total CK: 666 U/L — ABNORMAL HIGH (ref 38–234)

## 2018-08-20 SURGERY — ECHOCARDIOGRAM, TRANSESOPHAGEAL
Anesthesia: Monitor Anesthesia Care

## 2018-08-20 MED ORDER — PROPOFOL 10 MG/ML IV BOLUS
INTRAVENOUS | Status: DC | PRN
  Administered 2018-08-20: 20 mg via INTRAVENOUS
  Administered 2018-08-20: 10 mg via INTRAVENOUS

## 2018-08-20 MED ORDER — ENOXAPARIN SODIUM 40 MG/0.4ML ~~LOC~~ SOLN
40.0000 mg | SUBCUTANEOUS | Status: DC
  Administered 2018-08-20 – 2018-08-25 (×6): 40 mg via SUBCUTANEOUS
  Filled 2018-08-20 (×7): qty 0.4

## 2018-08-20 MED ORDER — BUTAMBEN-TETRACAINE-BENZOCAINE 2-2-14 % EX AERO
INHALATION_SPRAY | CUTANEOUS | Status: DC | PRN
  Administered 2018-08-20: 2 via TOPICAL

## 2018-08-20 MED ORDER — PROPOFOL 500 MG/50ML IV EMUL
INTRAVENOUS | Status: DC | PRN
  Administered 2018-08-20: 100 ug/kg/min via INTRAVENOUS

## 2018-08-20 MED ORDER — SODIUM CHLORIDE 0.9 % IV SOLN
INTRAVENOUS | Status: DC
  Administered 2018-08-20 (×2): via INTRAVENOUS

## 2018-08-20 NOTE — H&P (Signed)
   INTERVAL PROCEDURE H&P  History and Physical Interval Note:  08/20/2018 11:48 AM  Catherine Boyd has presented today for their planned procedure. The various methods of treatment have been discussed with the patient and family. After consideration of risks, benefits and other options for treatment, the patient has consented to the procedure.  The patients' outpatient history has been reviewed, patient examined, and no change in status from most recent office note within the past 30 days. I have reviewed the patients' chart and labs and will proceed as planned. Questions were answered to the patient's satisfaction.   Catherine NoseKenneth Catherine. Chauncey Bruno, MD, [facility] LPFACC, FACP  Catherine Boyd  [facility]  Medical Director of the [facility]  Card[facility]ovascular Risk Reduction [facility] Diplomate of the American Board of Clinical Lipidology Attending Cardiologist  Direct Dial: [phone]  Fax: [phone]  Website:  [email or web address]  Catherine Boyd Catherine Boyd 08/20/2018, 11:48 AM

## 2018-08-20 NOTE — Anesthesia Postprocedure Evaluation (Signed)
Anesthesia Post Note  Patient: Mathis DadLynette R Hennon  Procedure(s) Performed: TRANSESOPHAGEAL ECHOCARDIOGRAM (TEE) (N/A ) BUBBLE STUDY     Patient location during evaluation: Endoscopy Anesthesia Type: General Level of consciousness: awake and alert Pain management: pain level controlled Vital Signs Assessment: post-procedure vital signs reviewed and stable Respiratory status: spontaneous breathing, nonlabored ventilation, respiratory function stable and patient connected to nasal cannula oxygen Cardiovascular status: blood pressure returned to baseline and stable Postop Assessment: no apparent nausea or vomiting Anesthetic complications: no    Last Vitals:  Vitals:   08/20/18 1244 08/20/18 1606  BP: (!) 181/80 (!) 158/86  Pulse: 91 83  Resp: (!) 22 18  Temp:  37.1 C  SpO2: 99% 99%    Last Pain:  Vitals:   08/20/18 1606  TempSrc: Oral  PainSc:                  Kaylyne Axton COKER

## 2018-08-20 NOTE — Transfer of Care (Signed)
Immediate Anesthesia Transfer of Care Note  Patient: Catherine Boyd  Procedure(s) Performed: TRANSESOPHAGEAL ECHOCARDIOGRAM (TEE) (N/A ) BUBBLE STUDY  Patient Location: Endoscopy Unit  Anesthesia Type:MAC  Level of Consciousness: awake, alert  and oriented  Airway & Oxygen Therapy: Patient Spontanous Breathing and Patient connected to nasal cannula oxygen  Post-op Assessment: Report given to RN and Post -op Vital signs reviewed and stable  Post vital signs: Reviewed and stable  Last Vitals:  Vitals Value Taken Time  BP 188/101 08/20/2018 12:31 PM  Temp    Pulse 97 08/20/2018 12:32 PM  Resp 19 08/20/2018 12:32 PM  SpO2 100 % 08/20/2018 12:32 PM  Vitals shown include unvalidated device data.  Last Pain:  Vitals:   08/20/18 1135  TempSrc: Oral  PainSc: 0-No pain         Complications: No apparent anesthesia complications

## 2018-08-20 NOTE — Consult Note (Addendum)
ELECTROPHYSIOLOGY CONSULT NOTE  Patient ID: Catherine Boyd MRN: [id number], DOB/AGE: 11-21-1955   Admit date: 08/18/2018 Date of Consult: 08/20/2018  Primary Physician: Renford Dills, MD Primary Cardiologist: none Reason for Consultation: Cryptogenic stroke ; recommendations regarding Implantable Loop Recorder, requested by Dr. Pearlean Brownie  History of Present Illness Catherine Boyd was admitted on 08/18/2018 with stroke.   PMHx notable for HTN, DM, no known cardiac historyThey first developed symptoms while ambulating at home.  Larey Seat and unable to get up, apparently stayed on the floor for 2 days prior to someone finding her.  Imaging demonstrated large L basal ganglia and L insular infarcts embolic secondary to unknown source .  she has undergone workup for stroke including echocardiogram and carotid dopplers.  The patient has been monitored on telemetry which has demonstrated sinus rhythm with no arrhythmias.  Inpatient stroke work-up is to be completed with a TEE.   Echocardiogram this admission demonstrated Study Conclusions  - Left ventricle: The cavity size was normal. There was moderate   concentric hypertrophy. Systolic function was normal. The   estimated ejection fraction was in the range of 60% to 65%. Wall   motion was normal; there were no regional wall motion   abnormalities. There was an increased relative contribution of   atrial contraction to ventricular filling. Doppler parameters are   consistent with abnormal left ventricular relaxation (grade 1   diastolic dysfunction). Doppler parameters are consistent with   high ventricular filling pressure. - Pulmonary arteries: Systolic pressure could not be accurately   estimated.   Lab work is reviewed. WBC on 08/18/18 was 16.1 UA 11/25 abnormal afebrile  Prior to admission, the patient denies chest pain, shortness of breath, dizziness, palpitations, or syncope.  They is recovering from their stroke though remains  with significant R sided deficit, with plans to rehab at discharge.   Past Medical History:  Diagnosis Date  . Diabetes 1.5, managed as type 2 (HCC)   . Diabetes mellitus without complication (HCC)   . Hypertension      Surgical History: History reviewed. No pertinent surgical history.   Facility-Administered Medications Prior to Admission  Medication Dose Route Frequency Provider Last Rate Last Dose  . Bevacizumab (AVASTIN) SOLN 1.25 mg  1.25 mg Intravitreal  Rennis Chris, MD   1.25 mg at 05/05/18 1625  . Bevacizumab (AVASTIN) SOLN 1.25 mg  1.25 mg Intravitreal  Rennis Chris, MD   1.25 mg at 06/04/18 2331  . Bevacizumab (AVASTIN) SOLN 1.25 mg  1.25 mg Intravitreal  Rennis Chris, MD   1.25 mg at 07/09/18 0151   Medications Prior to Admission  Medication Sig Dispense Refill Last Dose  . atorvastatin (LIPITOR) 10 MG tablet Take 10 mg by mouth daily.   08/15/2018  . glimepiride (AMARYL) 2 MG tablet Take 2 mg by mouth daily with breakfast.   08/15/2018  . losartan (COZAAR) 50 MG tablet Take 50 mg by mouth daily.   08/15/2018    Inpatient Medications:  .  stroke: mapping our early stages of recovery book   Does not apply Once  . amLODipine  5 mg Oral Daily  . aspirin  325 mg Oral Daily  . atorvastatin  40 mg Oral Daily  . enoxaparin (LOVENOX) injection  30 mg Subcutaneous Q24H  . insulin aspart  0-15 Units Subcutaneous TID WC  . insulin aspart  0-5 Units Subcutaneous QHS  . pneumococcal 23 valent vaccine  0.5 mL Intramuscular Tomorrow-1000    Allergies:  Allergies  Allergen Reactions  . Penicillins Other (See Comments)    fainted    Social History   Socioeconomic History  . Marital status: Unknown    Spouse name: Not on file  . Number of children: Not on file  . Years of education: Not on file  . Highest education level: Not on file  Occupational History  . Not on file  Social Needs  . Financial resource strain: Not on file  . Food insecurity:    Worry: Not on  file    Inability: Not on file  . Transportation needs:    Medical: Not on file    Non-medical: Not on file  Tobacco Use  . Smoking status: Never Smoker  . Smokeless tobacco: Never Used  Substance and Sexual Activity  . Alcohol use: Yes    Alcohol/week: 1.0 standard drinks    Types: 1 Glasses of wine per week    Frequency: Never  . Drug use: Never  . Sexual activity: Not on file  Lifestyle  . Physical activity:    Days per week: Not on file    Minutes per session: Not on file  . Stress: Not on file  Relationships  . Social connections:    Talks on phone: Not on file    Gets together: Not on file    Attends religious service: Not on file    Active member of club or organization: Not on file    Attends meetings of clubs or organizations: Not on file    Relationship status: Not on file  . Intimate partner violence:    Fear of current or ex partner: Not on file    Emotionally abused: Not on file    Physically abused: Not on file    Forced sexual activity: Not on file  Other Topics Concern  . Not on file  Social History Narrative  . Not on file     Family History  Problem Relation Age of Onset  . Stroke Mother   . Stroke Father   . Hypertension Father   . Diabetes Paternal Uncle       Review of Systems: All other systems reviewed and are otherwise negative except as noted above.  Physical Exam: Vitals:   08/19/18 1948 08/19/18 2359 08/20/18 0411 08/20/18 0832  BP: 126/70 129/70 137/71 (!) 159/76  Pulse: (!) 101 99 76 85  Resp: 18 18 18 18   Temp: 98.7 F (37.1 C) 98.5 F (36.9 C) 98.7 F (37.1 C) 98 F (36.7 C)  TempSrc: Oral Oral Oral Oral  SpO2: 97% 98% 96% 97%  Weight:      Height:        GEN- The patient is well appearing, alert and oriented x 3 today.   Head- normocephalic, atraumatic Eyes-  Sclera clear, conjunctiva pink Ears- hearing intact Oropharynx- clear Neck- supple Lungs- CTA b/l, normal work of breathing Heart-RRR, no murmurs, rubs or  gallops  GI- soft, NT, ND Extremities- no clubbing, cyanosis, or edema MS- no significant deformity or atrophy Skin- no rash or lesion Psych- euthymic mood, full affect   Labs:   Lab Results  Component Value Date   WBC 16.1 (H) 08/18/2018   HGB 13.6 08/18/2018   HCT 43.9 08/18/2018   MCV 84.4 08/18/2018   PLT 276 08/18/2018    Recent Labs  Lab 08/20/18 0639  NA 142  K 3.9  CL 117*  CO2 21*  BUN 37*  CREATININE 1.44*  CALCIUM 7.7*  PROT 5.4*  BILITOT 0.8  ALKPHOS 62  ALT 39  AST 31  GLUCOSE 123*   Lab Results  Component Value Date   CKTOTAL 666 (H) 08/20/2018   Lab Results  Component Value Date   CHOL 153 08/19/2018   Lab Results  Component Value Date   HDL 49 08/19/2018   Lab Results  Component Value Date   LDLCALC 75 08/19/2018   Lab Results  Component Value Date   TRIG 143 08/19/2018   Lab Results  Component Value Date   CHOLHDL 3.1 08/19/2018   No results found for: LDLDIRECT  No results found for: DDIMER   Radiology/Studies:    Ct Head Wo Contrast Result Date: 08/18/2018 CLINICAL DATA:  62 year old female found down.  Initial encounter. EXAM: CT HEAD WITHOUT CONTRAST TECHNIQUE: Contiguous axial images were obtained from the base of the skull through the vertex without intravenous contrast. COMPARISON:  None. FINDINGS: Brain: Acute nonhemorrhagic infarct suspected involving the left corona radiata/lenticular nucleus. Remote small anteromedial left thalamic infarct. Prominent chronic microvascular changes. No intracranial hemorrhage. Global atrophy. Slight asymmetry lateral ventricles without mass seen causing this appearance. The ventricular size is most likely related to atrophy rather hydrocephalus. Aqueduct is patent. No intracranial mass lesion noted on this unenhanced exam. Vascular: Vascular calcifications. Intracranial vasculature is ectatic with calcified plaque cavernous segment internal carotid arteries and portion of vertebral arteries.  Skull: No skull fracture Sinuses/Orbits: No acute orbital abnormality. Visualized paranasal sinuses are clear. Other: Mastoid air cells and middle ear cavities are clear. IMPRESSION: 1. Acute nonhemorrhagic infarct suspected involving the left corona radiata/lenticular nucleus. 2. Remote small anteromedial left thalamic infarct. 3. Prominent chronic microvascular changes. 4. No intracranial hemorrhage. 5. Global atrophy. These results were called by telephone at the time of interpretation on 08/18/2018 at 12:31 pm to Dr. Azalia Bilis , who verbally acknowledged these results. Electronically Signed   By: Lacy Duverney M.D.   On: 08/18/2018 12:40    Mr Brain Wo Contrast Result Date: 08/18/2018 CLINICAL DATA:  62 y/o  F; found down.  Stroke follow-up. EXAM: MRI HEAD WITHOUT CONTRAST MRA HEAD WITHOUT CONTRAST TECHNIQUE: Multiplanar, multiecho pulse sequences of the brain and surrounding structures were obtained without intravenous contrast. Angiographic images of the head were obtained using MRA technique without contrast. COMPARISON:  08/18/2018 CT head. FINDINGS: MRI HEAD FINDINGS Brain: Focus of reduced diffusion of left putamen, corona radiata, and caudate body measuring 2.5 x 1.2 x 2.5 cm (volume = 3.9 cm^3) (AP x ML x CC series 3, image 38 and series 5, image 17) compatible with acute/early subacute infarction. Additional punctate focus of reduced diffusion within the right anterior insula. 12 mm focus of intermediate diffusion within the right brachium pontis compatible with subacute infarct (series 3, image 19). Patchy comp nonspecific T2 FLAIR hyperintensities in subcortical and periventricular white matter as well as the pons are compatible with advanced chronic microvascular ischemic changes for age. Moderate to severe volume loss of the brain. 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 Punctate foci of susceptibility hypointensity compatible with hemosiderin deposition of chronic microhemorrhage are present scattered throughout the brain in a predominantly central distribution. Vascular: As below. Skull and upper cervical spine: Normal marrow signal. Sinuses/Orbits: Negative. Other: None. MRA HEAD FINDINGS Internal carotid arteries:  Patent. Anterior cerebral arteries:  Patent. Middle cerebral arteries: Patent. Anterior communicating artery: Not  identified, likely hypoplastic or absent. Posterior communicating arteries:  Patent.  Fetal left PCA. Posterior cerebral arteries:  Patent. Basilar artery:  Patent. Vertebral arteries: Patent. Left dominant. Right vertebral artery terminates in right PICA. Persistent right trigeminal artery variant anatomy. No evidence of high-grade stenosis, large vessel occlusion, or aneurysm. IMPRESSION: MRI head: 1. 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 No associated hemorrhage or mass effect. Additional punctate focus in left anterior insula. 2. 12 mm subacute infarction within right brachium pontis. 3. Advanced chronic microvascular ischemic changes and moderate volume loss of the brain. 4. Multiple foci of chronic microhemorrhage with predominantly central distribution favoring sequelae of chronic hypertension. 5. Left paramedian frontoparietal region arachnoid cyst. MRA head: 1. No large vessel occlusion, aneurysm, or significant stenosis is identified. 2. Incidental persistent right trigeminal artery noted. These results will be called to the ordering clinician or representative by the Radiologist Assistant, and communication documented in the PACS or zVision Dashboard. Electronically Signed   By: Mitzi Hansen M.D.   On: 08/18/2018 19:44    Vas US Carotid (at [facility]) Result Date: 08/19/2018 Carotid Arterial Duplex Study Indications: CVA.  Limitations: limited visualization- shadowing Performing Technologist: Blanch Media RVS  Examination Guidelines: A complete evaluation includes B-mode imaging, spectral Doppler, color Doppler, and power Doppler as needed of all accessible portions of each vessel. Bilateral testing is considered an integral part of a complete examination. Limited examinations for reoccurring indications may be performed as noted.    Summary: Right Carotid: Velocities in the right ICA are consistent with a 1-39% stenosis. Left Carotid: Velocities in the left ICA are consistent with a 1-39% stenosis. Vertebrals: Bilateral vertebral arteries demonstrate antegrade flow. *See table(s) above for measurements and observations.  Electronically signed by Delia Heady MD on 08/19/2018 at 2:26:32 PM.    Final     12-lead ECG SR All prior EKG's in EPIC reviewed with no documented atrial fibrillation  Telemetry SR  Assessment and Plan:  1. Cryptogenic stroke The patient presents with cryptogenic stroke.  The patient has a TEE planned for this AM.  I spoke at length with the patient about monitoring for afib with either a 30 day event monitor or an implantable loop recorder.  Risks, benefits, and alteratives to implantable loop recorder were discussed with the patient today.     The patient is planning to move immediately to [place] with her family upon discharge from the hospital/rehab, with no plans to stay in [place] or [place] going forward. Her sister who will be her primary caregiver it seems has a cardiologist of her own she will reach out to to get the patient set up to consider loop implant up there.  I think this is the most reasonable approach sine we know she is moving.   Please call with questions.   Sheilah Pigeon, PA-C 08/20/2018   I have reviewed the above assessment and plan.  Changes to above are made where necessary.  Pt declines ILR at this time but will follow-up with cardiology when she moves.  Co  Sign: Hillis Range, MD 08/20/2018 2:52 PM

## 2018-08-20 NOTE — Anesthesia Procedure Notes (Signed)
Procedure Name: MAC Date/Time: 08/20/2018 12:08 PM Performed by: Marsa Aris, CRNA Pre-anesthesia Checklist: Patient identified, Emergency Drugs available, Suction available, Patient being monitored and Timeout performed Patient Re-evaluated:Patient Re-evaluated prior to induction Oxygen Delivery Method: Nasal cannula Preoxygenation: Pre-oxygenation with 100% oxygen

## 2018-08-20 NOTE — Anesthesia Preprocedure Evaluation (Signed)
Anesthesia Evaluation  Patient identified by MRN, date of birth, ID band Patient awake    Reviewed: Allergy & Precautions, NPO status , Patient's Chart, lab work & pertinent test results  Airway Mallampati: II  TM Distance: >3 FB Neck ROM: Full    Dental   Pulmonary    breath sounds clear to auscultation       Cardiovascular hypertension,  Rhythm:Irregular Rate:Normal     Neuro/Psych    GI/Hepatic   Endo/Other  diabetes  Renal/GU      Musculoskeletal   Abdominal   Peds  Hematology   Anesthesia Other Findings   Reproductive/Obstetrics                             Anesthesia Physical Anesthesia Plan  ASA: III  Anesthesia Plan: General   Post-op Pain Management:    Induction: Intravenous  PONV Risk Score and Plan:   Airway Management Planned: Mask  Additional Equipment:   Intra-op Plan:   Post-operative Plan:   Informed Consent: I have reviewed the patients History and Physical, chart, labs and discussed the procedure including the risks, benefits and alternatives for the proposed anesthesia with the patient or authorized representative who has indicated his/her understanding and acceptance.     Plan Discussed with: CRNA and Anesthesiologist  Anesthesia Plan Comments:         Anesthesia Quick Evaluation

## 2018-08-20 NOTE — Care Management (Signed)
CM met with the patient's sister yesterday after asking it patient gave permission. Sister is interested in taking patient to [place] area after d/c. CM provided her with information on the different therapy options: CIR, SNF, HH and outpatient. Per sister the patient does not have insurance that is going to cover any type of IR or SNF rehab. Sister is contemplating self pay for SNF rehab in the [place] area. CM encourage her to call a couple facilities there and have them inform her of the cost and availability. Sister also asking for companies that provide medical transport. CM provided her this information this am, with the understanding that it would be private pay.   Sister asking about disability. CM referred her to [place] for these questions as patient currently has insurance.  CM continuing to follow.

## 2018-08-20 NOTE — Progress Notes (Signed)
PROGRESS NOTE        PATIENT DETAILS Name: Catherine Boyd Age: 62 y.o. Sex: female Date of Birth: 1956/05/29 Admit Date: 08/18/2018 Admitting Physician Elyse Hsu, MD ZOX:WRUEAV, Windy Fast, MD  Brief Narrative: Patient is a 62 y.o. female with history of DM-2, dyslipidemia, hypertension-to the emergency room on 11/25 after being found down at home-further evaluation revealed a acute CVA and mild rhabdomyolysis.  See below for further details  Subjective: Has dysarthria-right-sided weakness apparently is unchanged per patient-denies any chest pain or shortness of breath.  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  MRA brain without any large vessel stenosis, carotid Doppler without any significant stenosis.  TEE negative for thrombus or any other embolic source.  Cardiology recommending against loop recorder implantation as patient planning to move to [place].  A1c 6.6, LDL 75.  Continue aspirin, statin-and await further recommendations from neurology.  Rhabdomyolysis: Secondary to being down-appears to be mild-CK downtrending slowly.  AKI: Likely hemodynamically mediated-improving with supportive care.  Hypertension: Controlled-continue with amlodipine.  Dyslipidemia: LDL 75-on statin.  DM-2: CBG stable-continue with SSI-resume oral hypoglycemic agents on discharge.  DVT Prophylaxis: Prophylactic Lovenox   Code Status: Full code   Family Communication: None at bedside  Disposition Plan: Remain inpatient  Antimicrobial agents: Anti-infectives (From admission, onward)   None      Procedures: None  CONSULTS:  neurology  Time spent: 25- minutes-Greater than 50% of this time was spent in counseling, explanation of diagnosis, planning of further management, and coordination of care.  MEDICATIONS: Scheduled Meds: .  stroke: mapping our early stages of recovery book   Does not apply Once   . amLODipine  5 mg Oral Daily  . aspirin  325 mg Oral Daily  . atorvastatin  40 mg Oral Daily  . enoxaparin (LOVENOX) injection  40 mg Subcutaneous Q24H  . insulin aspart  0-15 Units Subcutaneous TID WC  . insulin aspart  0-5 Units Subcutaneous QHS  . pneumococcal 23 valent vaccine  0.5 mL Intramuscular Tomorrow-1000   Continuous Infusions: . sodium chloride 200 mL/hr at 08/20/18 0838   PRN Meds:.acetaminophen **OR** acetaminophen (TYLENOL) oral liquid 160 mg/5 mL **OR** acetaminophen, senna-docusate, white petrolatum   PHYSICAL EXAM: Vital signs: Vitals:   08/20/18 1135 08/20/18 1231 08/20/18 1234 08/20/18 1244  BP: (!) 177/78  (!) 179/84 (!) 181/80  Pulse: 85 100 93 91  Resp: 17 19 16  (!) 22  Temp: 98.4 F (36.9 C)  98.4 F (36.9 C)   TempSrc: Oral  Oral   SpO2: 100% 98% 99% 99%  Weight: 85.5 kg     Height: 5\' 9"  (1.753 m)      Filed Weights   08/18/18 2153 08/20/18 1135  Weight: 85.5 kg 85.5 kg   Body mass index is 27.84 kg/m.   General appearance :Awake, alert, not in any distress.  Dysarthria-slight right facial droop Eyes:, pupils equally reactive to light and accomodation,no scleral icterus.Pink conjunctiva HEENT: Atraumatic and Normocephalic Neck: supple Resp:Good air entry bilaterally, no added sounds  CVS: S1 S2 regular, no murmurs.  GI: Bowel sounds present, Non tender and not distended with no gaurding, rigidity or rebound.No organomegaly Extremities: B/L Lower Ext shows no edema, both legs are warm to touch Neurology: Right upper extremity around 2/5, right lower extremity around 3/5. Musculoskeletal:No digital cyanosis Skin:No Rash, warm and  dry Wounds:N/A  I have personally reviewed following labs and imaging studies  LABORATORY DATA: CBC: Recent Labs  Lab 08/18/18 1051 08/18/18 1111  WBC  --  16.1*  NEUTROABS  --  13.4*  HGB 15.0 13.6  HCT 44.0 43.9  MCV  --  84.4  PLT  --  276    Basic Metabolic Panel: Recent Labs  Lab  08/18/18 1051 08/18/18 1111 08/19/18 0739 08/20/18 0639  NA 138 141 143 142  K 4.6 4.2 3.8 3.9  CL 107 103 114* 117*  CO2  --  25 20* 21*  GLUCOSE 197* 190* 110* 123*  BUN 65* 61* 55* 37*  CREATININE 1.70* 1.88* 1.69* 1.44*  CALCIUM  --  9.1 8.1* 7.7*    GFR: Estimated Creatinine Clearance: 47.9 mL/min (A) (by C-G formula based on SCr of 1.44 mg/dL (H)).  Liver Function Tests: Recent Labs  Lab 08/18/18 1111 08/19/18 0739 08/20/18 0639  AST 46* 35 31  ALT 77* 49* 39  ALKPHOS 89 63 62  BILITOT 1.1 1.1 0.8  PROT 7.6 5.8* 5.4*  ALBUMIN 4.0 3.0* 2.7*   No results for input(s): LIPASE, AMYLASE in the last 168 hours. No results for input(s): AMMONIA in the last 168 hours.  Coagulation Profile: Recent Labs  Lab 08/18/18 1111  INR 1.12    Cardiac Enzymes: Recent Labs  Lab 08/18/18 1339 08/19/18 0739 08/20/18 0639  CKTOTAL 1,154* 894* 666*    BNP (last 3 results) No results for input(s): PROBNP in the last 8760 hours.  HbA1C: Recent Labs    08/19/18 0739  HGBA1C 6.6*    CBG: Recent Labs  Lab 08/19/18 0613 08/19/18 1104 08/19/18 1712 08/19/18 2117 08/20/18 0601  GLUCAP 89 134* 142* 189* 119*    Lipid Profile: Recent Labs    08/19/18 0739  CHOL 153  HDL 49  LDLCALC 75  TRIG 143  CHOLHDL 3.1    Thyroid Function Tests: No results for input(s): TSH, T4TOTAL, FREET4, T3FREE, THYROIDAB in the last 72 hours.  Anemia Panel: No results for input(s): VITAMINB12, FOLATE, FERRITIN, TIBC, IRON, RETICCTPCT in the last 72 hours.  Urine analysis:    Component Value Date/Time   COLORURINE YELLOW 08/19/2018 1935   APPEARANCEUR HAZY (A) 08/19/2018 1935   LABSPEC 1.026 08/19/2018 1935   PHURINE 5.0 08/19/2018 1935   GLUCOSEU NEGATIVE 08/19/2018 1935   HGBUR NEGATIVE 08/19/2018 1935   BILIRUBINUR NEGATIVE 08/19/2018 1935   KETONESUR 5 (A) 08/19/2018 1935   PROTEINUR NEGATIVE 08/19/2018 1935   NITRITE NEGATIVE 08/19/2018 1935   LEUKOCYTESUR TRACE  (A) 08/19/2018 1935    Sepsis Labs: Lactic Acid, Venous No results found for: LATICACIDVEN  MICROBIOLOGY: No results found for this or any previous visit (from the past 240 hour(s)).  RADIOLOGY STUDIES/RESULTS: Ct Head Wo Contrast  Result Date: 08/18/2018 CLINICAL DATA:  62 year old female found down.  Initial encounter. EXAM: CT HEAD WITHOUT CONTRAST TECHNIQUE: Contiguous axial images were obtained from the base of the skull through the vertex without intravenous contrast. COMPARISON:  None. FINDINGS: Brain: Acute nonhemorrhagic infarct suspected involving the left corona radiata/lenticular nucleus. Remote small anteromedial left thalamic infarct. Prominent chronic microvascular changes. No intracranial hemorrhage. Global atrophy. Slight asymmetry lateral ventricles without mass seen causing this appearance. The ventricular size is most likely related to atrophy rather hydrocephalus. Aqueduct is patent. No intracranial mass lesion noted on this unenhanced exam. Vascular: Vascular calcifications. Intracranial vasculature is ectatic with calcified plaque cavernous segment internal carotid arteries and portion of vertebral arteries.  Skull: No skull fracture Sinuses/Orbits: No acute orbital abnormality. Visualized paranasal sinuses are clear. Other: Mastoid air cells and middle ear cavities are clear. IMPRESSION: 1. Acute nonhemorrhagic infarct suspected involving the left corona radiata/lenticular nucleus. 2. Remote small anteromedial left thalamic infarct. 3. Prominent chronic microvascular changes. 4. No intracranial hemorrhage. 5. Global atrophy. These results were called by telephone at the time of interpretation on 08/18/2018 at 12:31 pm to Dr. Azalia Bilis , who verbally acknowledged these results. Electronically Signed   By: Lacy Duverney M.D.   On: 08/18/2018 12:40   Mr Brain Wo Contrast  Result Date: 08/18/2018 CLINICAL DATA:  62 y/o  F; found down.  Stroke follow-up. EXAM: MRI HEAD WITHOUT  CONTRAST MRA HEAD WITHOUT CONTRAST TECHNIQUE: Multiplanar, multiecho pulse sequences of the brain and surrounding structures were obtained without intravenous contrast. Angiographic images of the head were obtained using MRA technique without contrast. COMPARISON:  08/18/2018 CT head. FINDINGS: MRI HEAD FINDINGS Brain: Focus of reduced diffusion of left putamen, corona radiata, and caudate body measuring 2.5 x 1.2 x 2.5 cm (volume = 3.9 cm^3) (AP x ML x CC series 3, image 38 and series 5, image 17) compatible with acute/early subacute infarction. Additional punctate focus of reduced diffusion within the right anterior insula. 12 mm focus of intermediate diffusion within the right brachium pontis compatible with subacute infarct (series 3, image 19). Patchy comp nonspecific T2 FLAIR hyperintensities in subcortical and periventricular white matter as well as the pons are compatible with advanced chronic microvascular ischemic changes for age. Moderate to severe volume loss of the brain. 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 Punctate foci of susceptibility hypointensity compatible with hemosiderin deposition of chronic microhemorrhage are present scattered throughout the brain in a predominantly central distribution. Vascular: As below. Skull and upper cervical spine: Normal marrow signal. Sinuses/Orbits: Negative. Other: None. MRA HEAD FINDINGS Internal carotid arteries:  Patent. Anterior cerebral arteries:  Patent. Middle cerebral arteries: Patent. Anterior communicating artery: Not identified, likely hypoplastic or absent. Posterior communicating arteries:  Patent.  Fetal left PCA. Posterior cerebral arteries:  Patent. Basilar artery:  Patent. Vertebral arteries: Patent. Left dominant. Right vertebral artery terminates in right PICA. Persistent right trigeminal artery  variant anatomy. No evidence of high-grade stenosis, large vessel occlusion, or aneurysm. IMPRESSION: MRI head: 1. 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 No associated hemorrhage or mass effect. Additional punctate focus in left anterior insula. 2. 12 mm subacute infarction within right brachium pontis. 3. Advanced chronic microvascular ischemic changes and moderate volume loss of the brain. 4. Multiple foci of chronic microhemorrhage with predominantly central distribution favoring sequelae of chronic hypertension. 5. Left paramedian frontoparietal region arachnoid cyst. MRA head: 1. No large vessel occlusion, aneurysm, or significant stenosis is identified. 2. Incidental persistent right trigeminal artery noted. These results will be called to the ordering clinician or representative by the Radiologist Assistant, and communication documented in the PACS or zVision Dashboard. Electronically Signed   By: Mitzi Hansen M.D.   On: 08/18/2018 19:44   Mr Maxine Glenn Head Wo Contrast  Result Date: 08/18/2018 CLINICAL DATA:  62 y/o  F; found down.  Stroke follow-up. EXAM: MRI HEAD WITHOUT CONTRAST MRA HEAD WITHOUT CONTRAST TECHNIQUE: Multiplanar, multiecho pulse sequences of the brain and surrounding structures were obtained without intravenous  contrast. Angiographic images of the head were obtained using MRA technique without contrast. COMPARISON:  08/18/2018 CT head. FINDINGS: MRI HEAD FINDINGS Brain: Focus of reduced diffusion of left putamen, corona radiata, and caudate body measuring 2.5 x 1.2 x 2.5 cm (volume = 3.9 cm^3) (AP x ML x CC series 3, image 38 and series 5, image 17) compatible with acute/early subacute infarction. Additional punctate focus of reduced diffusion within the right anterior insula. 12 mm focus of intermediate diffusion within the right brachium pontis compatible with subacute infarct (series 3, image 19). Patchy  comp nonspecific T2 FLAIR hyperintensities in subcortical and periventricular white matter as well as the pons are compatible with advanced chronic microvascular ischemic changes for age. Moderate to severe volume loss of the brain. 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 Punctate foci of susceptibility hypointensity compatible with hemosiderin deposition of chronic microhemorrhage are present scattered throughout the brain in a predominantly central distribution. Vascular: As below. Skull and upper cervical spine: Normal marrow signal. Sinuses/Orbits: Negative. Other: None. MRA HEAD FINDINGS Internal carotid arteries:  Patent. Anterior cerebral arteries:  Patent. Middle cerebral arteries: Patent. Anterior communicating artery: Not identified, likely hypoplastic or absent. Posterior communicating arteries:  Patent.  Fetal left PCA. Posterior cerebral arteries:  Patent. Basilar artery:  Patent. Vertebral arteries: Patent. Left dominant. Right vertebral artery terminates in right PICA. Persistent right trigeminal artery variant anatomy. No evidence of high-grade stenosis, large vessel occlusion, or aneurysm. IMPRESSION: MRI head: 1. 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 No associated hemorrhage or mass effect. Additional punctate focus in left anterior insula. 2. 12 mm subacute infarction within right brachium pontis. 3. Advanced chronic microvascular ischemic changes and moderate volume loss of the brain. 4. Multiple foci of chronic microhemorrhage with predominantly central distribution favoring sequelae of chronic hypertension. 5. Left paramedian frontoparietal region arachnoid cyst. MRA head: 1. No large vessel occlusion, aneurysm, or significant stenosis is identified. 2. Incidental persistent  right trigeminal artery noted. These results will be called to the ordering clinician or representative by the Radiologist Assistant, and communication documented in the PACS or zVision Dashboard. Electronically Signed   By: Mitzi HansenLance  Furusawa-Stratton M.D.   On: 08/18/2018 19:44   [facility] [place]  Result Date: 08/19/2018 Carotid Arterial Duplex Study Indications: CVA. Limitations: limited visualization- shadowing Performing Technologist: Blanch MediaMegan Riddle RVS  Examination Guidelines: A complete evaluation includes B-mode imaging, spectral Doppler, color Doppler, and power Doppler as needed of all accessible portions of each vessel. Bilateral testing is considered an integral part of a complete examination. Limited examinations for reoccurring indications may be performed as noted.  Right Carotid Findings: +----------+--------+--------+--------+------------+--------+           PSV cm/sEDV cm/sStenosisDescribe    Comments +----------+--------+--------+--------+------------+--------+ CCA Prox  138     12              heterogenous         +----------+--------+--------+--------+------------+--------+ CCA Distal111     9               heterogenous         +----------+--------+--------+--------+------------+--------+ ICA Prox  80      16      1-39%   heterogenous         +----------+--------+--------+--------+------------+--------+  ICA Distal65      12                                   +----------+--------+--------+--------+------------+--------+ ECA       69                                           +----------+--------+--------+--------+------------+--------+ +----------+--------+-------+--------+-------------------+           PSV cm/sEDV cmsDescribeArm Pressure (mmHG) +----------+--------+-------+--------+-------------------+ [id number]                                        +----------+--------+-------+--------+-------------------+  +---------+--------+--+--------+-+---------+ VertebralPSV cm/s83EDV cm/s6Antegrade +---------+--------+--+--------+-+---------+  Left Carotid Findings: +----------+--------+--------+--------+------------+--------------+           PSV cm/sEDV cm/sStenosisDescribe    Comments       +----------+--------+--------+--------+------------+--------------+ CCA Prox  127     15              heterogenous               +----------+--------+--------+--------+------------+--------------+ CCA Distal125     11              heterogenous               +----------+--------+--------+--------+------------+--------------+ ICA Prox  34      6       1-39%   heterogenous               +----------+--------+--------+--------+------------+--------------+ ICA Distal                                    not visualized +----------+--------+--------+--------+------------+--------------+ ECA       92                                                 +----------+--------+--------+--------+------------+--------------+ +----------+--------+--------+--------+-------------------+ SubclavianPSV cm/sEDV cm/sDescribeArm Pressure (mmHG) +----------+--------+--------+--------+-------------------+           198                                         +----------+--------+--------+--------+-------------------+ +---------+--------+--+--------+-+---------+ VertebralPSV cm/s57EDV cm/s9Antegrade +---------+--------+--+--------+-+---------+  Summary: Right Carotid: Velocities in the right ICA are consistent with a 1-39% stenosis. Left Carotid: Velocities in the left ICA are consistent with a 1-39% stenosis. Vertebrals: Bilateral vertebral arteries demonstrate antegrade flow. *See table(s) above for measurements and observations.  Electronically signed by Delia Heady MD on 08/19/2018 at 2:26:32 PM.    Final      LOS: 2 days   Jeoffrey Massed, MD  [facility]  If 7PM-7AM, please contact  night-coverage  Please page via [email or web address]-Password [email or web address]-click on MD name and type text message  08/20/2018, 2:37 PM

## 2018-08-20 NOTE — Progress Notes (Signed)
Physical Therapy Treatment Patient Details Name: Catherine Boyd MRN: [id number] DOB: 01/11/1956 Today's Date: 08/20/2018    History of Present Illness pt is a 62 y/o female with PMH significant for HTN, DM, presenting to ED after having been found down at home, having been down all weekend.  MRI showed Acute/early subacute infart within the L BG and corona radiate pluc additional puctate focus in left anterior insula.  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    PT Comments    Improving steadily.  Able to progress to gait, but did not advance her feet as well as expected. Emphasized transitions, standing activity and gait.   Follow Up Recommendations  CIR;Supervision/Assistance - 24 hour     Equipment Recommendations  Other (comment)(TBA)    Recommendations for Other Services Rehab consult     Precautions / Restrictions Precautions Precautions: Fall Restrictions Weight Bearing Restrictions: No    Mobility  Bed Mobility Overal bed mobility: Needs Assistance Bed Mobility: Supine to Sit     Supine to sit: Mod assist     General bed mobility comments: lighter mod assist needed to come up via stroner L elbow.  Pt needed stability assist at the trunk to give her time to assist with the L UE  Transfers Overall transfer level: Needs assistance Equipment used: Rolling walker (2 wheeled) Transfers: Sit to/from Stand Sit to Stand: Max assist         General transfer comment: needed assist to come forward, then boost  Ambulation/Gait Ambulation/Gait assistance: Max assist;Mod assist;+2 physical assistance;+2 safety/equipment Gait Distance (Feet): 5 Feet(foreward and back) Assistive device: Rolling walker (2 wheeled) Gait Pattern/deviations: Decreased step length - right;Decreased step length - left;Decreased stance time - left Gait velocity: extremely slow   General Gait Details: pt taking extremely small steps.  Pt needing w/shift and holds, but in  order to advance LE's more than and inch or 2, pt needed assist advancing either LE.   Stairs             Wheelchair Mobility    Modified Rankin (Stroke Patients Only) Modified Rankin (Stroke Patients Only) Pre-Morbid Rankin Score: No symptoms Modified Rankin: Moderately severe disability     Balance Overall balance assessment: Needs assistance Sitting-balance support: Feet supported;Single extremity supported Sitting balance-Leahy Scale: Poor Sitting balance - Comments: continuously listing posteriorly and given time and fatigue lost balance Postural control: Posterior lean;Left lateral lean Standing balance support: Single extremity supported;Bilateral upper extremity supported Standing balance-Leahy Scale: Poor Standing balance comment: reliant on external assist for standing balance                            Cognition Arousal/Alertness: Awake/alert Behavior During Therapy: WFL for tasks assessed/performed;Flat affect Overall Cognitive Status: Impaired/Different from baseline                     Current Attention Level: Sustained   Following Commands: Follows one step commands consistently;Follows one step commands with increased time     Problem Solving: Slow processing;Decreased initiation;Difficulty sequencing;Requires verbal cues;Requires tactile cues        Exercises      General Comments        Pertinent Vitals/Pain      Home Living                      Prior Function  PT Goals (current goals can now be found in the care plan section) Acute Rehab PT Goals Patient Stated Goal: none stated this session PT Goal Formulation: With patient Time For Goal Achievement: 09/02/18 Potential to Achieve Goals: Good Progress towards PT goals: Progressing toward goals    Frequency    Min 4X/week      PT Plan Current plan remains appropriate    Co-evaluation              AM-PAC PT "6 Clicks"  Mobility   Outcome Measure  Help needed turning from your back to your side while in a flat bed without using bedrails?: A Lot Help needed moving from lying on your back to sitting on the side of a flat bed without using bedrails?: A Lot Help needed moving to and from a bed to a chair (including a wheelchair)?: A Lot Help needed standing up from a chair using your arms (e.g., wheelchair or bedside chair)?: A Lot Help needed to walk in hospital room?: A Lot Help needed climbing 3-5 steps with a railing? : Total 6 Click Score: 11    End of Session   Activity Tolerance: Patient tolerated treatment well Patient left: in chair;with call bell/phone within reach;with chair alarm set Nurse Communication: Mobility status PT Visit Diagnosis: Unsteadiness on feet (R26.81);Hemiplegia and hemiparesis;Other abnormalities of gait and mobility (R26.89) Hemiplegia - Right/Left: Right Hemiplegia - dominant/non-dominant: Dominant Hemiplegia - caused by: Cerebral infarction     Time: 4098-1191 PT Time Calculation (min) (ACUTE ONLY): 24 min  Charges:  $Gait Training: 8-22 mins $Therapeutic Activity: 8-22 mins                     08/20/2018  [place] Bing, PT Acute Rehabilitation Services [phone]  (pager) [phone]  (office)   [name] 08/20/2018, 3:14 PM

## 2018-08-20 NOTE — CV Procedure (Signed)
   TRANSESOPHAGEAL ECHOCARDIOGRAM (TEE) NOTE  INDICATIONS: cryptogenic stroke  PROCEDURE:   Informed consent was obtained prior to the procedure. The risks, benefits and alternatives for the procedure were discussed and the patient comprehended these risks.  Risks include, but are not limited to, cough, sore throat, vomiting, nausea, somnolence, esophageal and stomach trauma or perforation, bleeding, low blood pressure, aspiration, pneumonia, infection, trauma to the teeth and death.    After a procedural time-out, the patient was given propofol per anesthesia for sedation.  The patient's heart rate, blood pressure, and oxygen saturation are monitored continuously during the procedure.The oropharynx was anesthetized with topical cetacaine.  The transesophageal probe was inserted in the esophagus and stomach without difficulty and multiple views were obtained.  The patient was kept under observation until the patient left the procedure room. The patient left the procedure room in stable condition.   Agitated microbubble saline contrast was administered.  COMPLICATIONS:    There were no immediate complications.  Findings:  1. LEFT VENTRICLE: The left ventricular wall thickness is moderately increased.  The left ventricular cavity is normal in size. Wall motion is normal.  LVEF is 60-65%.  2. RIGHT VENTRICLE:  The right ventricle is normal in structure and function without any thrombus or masses.    3. LEFT ATRIUM:  The left atrium is normal in size without any thrombus or masses.  There is not spontaneous echo contrast ("smoke") in the left atrium consistent with a low flow state.  4. LEFT ATRIAL APPENDAGE:  The left atrial appendage is free of any thrombus or masses. The appendage has multiple lobes. Pulse doppler indicates moderate flow in the appendage.  5. ATRIAL SEPTUM:  The atrial septum appears intact and is free of thrombus and/or masses.  There is no evidence for interatrial  shunting by color doppler and saline microbubble.  6. RIGHT ATRIUM:  The right atrium is normal in size and function without any thrombus or masses.  7. MITRAL VALVE:  The mitral valve is normal in structure and function with no regurgitation.  There were no vegetations or stenosis.  8. AORTIC VALVE:  The aortic valve is trileaflet, normal in structure and function with no regurgitation.  There were no vegetations or stenosis  9. TRICUSPID VALVE:  The tricuspid valve is normal in structure and function with trivial regurgitation.  There were no vegetations or stenosis  10.  PULMONIC VALVE:  The pulmonic valve is normal in structure and function with no regurgitation.  There were no vegetations or stenosis.   11. AORTIC ARCH, ASCENDING AND DESCENDING AORTA:  There was grade 1 Myrtis Ser(Katz et. Al, 1992) atherosclerosis of the ascending aorta, aortic arch, or proximal descending aorta.  12. PULMONARY VEINS: Anomalous pulmonary venous return was not noted.  13. PERICARDIUM: The pericardium appeared normal and non-thickened.  There is no pericardial effusion.  IMPRESSION:   1. No LAA thrombus 2. Negative for PFO 3. No significant aortic plaque 4. Moderate LVH 5. LVEF 60-65%  RECOMMENDATIONS:    1. No cardiac source of emboli is identified.  Time Spent Directly with the Patient:  45 minutes   Chrystie Nose C. , MD, [facility], FACP  [place]  [facility]  Medical Director of the [facility]  [facility] Diplomate of the American Board of Clinical Lipidology Attending Cardiologist  Direct Dial: [phone]  Fax: [phone]  Website:  [name]   C  08/20/2018, 12:28 PM

## 2018-08-20 NOTE — Progress Notes (Signed)
STROKE TEAM PROGRESS NOTE   INTERVAL HISTORY Her  RN is at the bedside.  No family present. She has just returned from TEE which was unremarkable.she has no complaints  Vitals:   08/20/18 1135 08/20/18 1231 08/20/18 1234 08/20/18 1244  BP: (!) 177/78  (!) 179/84 (!) 181/80  Pulse: 85 100 93 91  Resp: 17 19 16  (!) 22  Temp: 98.4 F (36.9 C)  98.4 F (36.9 C)   TempSrc: Oral  Oral   SpO2: 100% 98% 99% 99%  Weight: 85.5 kg     Height: 5\' 9"  (1.753 m)       CBC:  Recent Labs  Lab 08/18/18 1051 08/18/18 1111  WBC  --  16.1*  NEUTROABS  --  13.4*  HGB 15.0 13.6  HCT 44.0 43.9  MCV  --  84.4  PLT  --  276    Basic Metabolic Panel:  Recent Labs  Lab 08/19/18 0739 08/20/18 0639  NA 143 142  K 3.8 3.9  CL 114* 117*  CO2 20* 21*  GLUCOSE 110* 123*  BUN 55* 37*  CREATININE 1.69* 1.44*  CALCIUM 8.1* 7.7*   Lipid Panel:     Component Value Date/Time   CHOL 153 08/19/2018 0739   TRIG 143 08/19/2018 0739   HDL 49 08/19/2018 0739   CHOLHDL 3.1 08/19/2018 0739   VLDL 29 08/19/2018 0739   LDLCALC 75 08/19/2018 0739   HgbA1c:  Lab Results  Component Value Date   HGBA1C 6.6 (H) 08/19/2018   Urine Drug Screen:     Component Value Date/Time   LABOPIA NONE DETECTED 08/19/2018 0735   COCAINSCRNUR NONE DETECTED 08/19/2018 0735   LABBENZ NONE DETECTED 08/19/2018 0735   AMPHETMU NONE DETECTED 08/19/2018 0735   THCU NONE DETECTED 08/19/2018 0735   LABBARB NONE DETECTED 08/19/2018 0735    Alcohol Level     Component Value Date/Time   ETH <10 08/18/2018 1111    IMAGING Mr Brain Wo Contrast  Result Date: 08/18/2018 CLINICAL DATA:  62 y/o  F; found down.  Stroke follow-up. EXAM: MRI HEAD WITHOUT CONTRAST MRA HEAD WITHOUT CONTRAST TECHNIQUE: Multiplanar, multiecho pulse sequences of the brain and surrounding structures were obtained without intravenous contrast. Angiographic images of the head were obtained using MRA technique without contrast. COMPARISON:  08/18/2018  CT head. FINDINGS: MRI HEAD FINDINGS Brain: Focus of reduced diffusion of left putamen, corona radiata, and caudate body measuring 2.5 x 1.2 x 2.5 cm (volume = 3.9 cm^3) (AP x ML x CC series 3, image 38 and series 5, image 17) compatible with acute/early subacute infarction. Additional punctate focus of reduced diffusion within the right anterior insula. 12 mm focus of intermediate diffusion within the right brachium pontis compatible with subacute infarct (series 3, image 19). Patchy comp nonspecific T2 FLAIR hyperintensities in subcortical and periventricular white matter as well as the pons are compatible with advanced chronic microvascular ischemic changes for age. Moderate to severe volume loss of the brain. 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 Punctate foci of susceptibility hypointensity compatible with hemosiderin deposition of chronic microhemorrhage are present scattered throughout the brain in a predominantly central distribution. Vascular: As below. Skull and upper cervical spine: Normal marrow signal. Sinuses/Orbits: Negative. Other: None. MRA HEAD FINDINGS Internal carotid arteries:  Patent. Anterior cerebral arteries:  Patent. Middle cerebral arteries: Patent. Anterior  communicating artery: Not identified, likely hypoplastic or absent. Posterior communicating arteries:  Patent.  Fetal left PCA. Posterior cerebral arteries:  Patent. Basilar artery:  Patent. Vertebral arteries: Patent. Left dominant. Right vertebral artery terminates in right PICA. Persistent right trigeminal artery variant anatomy. No evidence of high-grade stenosis, large vessel occlusion, or aneurysm. IMPRESSION: MRI head: 1. 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 No associated hemorrhage or mass effect.  Additional punctate focus in left anterior insula. 2. 12 mm subacute infarction within right brachium pontis. 3. Advanced chronic microvascular ischemic changes and moderate volume loss of the brain. 4. Multiple foci of chronic microhemorrhage with predominantly central distribution favoring sequelae of chronic hypertension. 5. Left paramedian frontoparietal region arachnoid cyst. MRA head: 1. No large vessel occlusion, aneurysm, or significant stenosis is identified. 2. Incidental persistent right trigeminal artery noted. These results will be called to the ordering clinician or representative by the Radiologist Assistant, and communication documented in the PACS or zVision Dashboard. Electronically Signed   By: Mitzi Hansen M.D.   On: 08/18/2018 19:44   Mr Maxine Glenn Head Wo Contrast  Result Date: 08/18/2018 CLINICAL DATA:  62 y/o  F; found down.  Stroke follow-up. EXAM: MRI HEAD WITHOUT CONTRAST MRA HEAD WITHOUT CONTRAST TECHNIQUE: Multiplanar, multiecho pulse sequences of the brain and surrounding structures were obtained without intravenous contrast. Angiographic images of the head were obtained using MRA technique without contrast. COMPARISON:  08/18/2018 CT head. FINDINGS: MRI HEAD FINDINGS Brain: Focus of reduced diffusion of left putamen, corona radiata, and caudate body measuring 2.5 x 1.2 x 2.5 cm (volume = 3.9 cm^3) (AP x ML x CC series 3, image 38 and series 5, image 17) compatible with acute/early subacute infarction. Additional punctate focus of reduced diffusion within the right anterior insula. 12 mm focus of intermediate diffusion within the right brachium pontis compatible with subacute infarct (series 3, image 19). Patchy comp nonspecific T2 FLAIR hyperintensities in subcortical and periventricular white matter as well as the pons are compatible with advanced chronic microvascular ischemic changes for age. Moderate to severe volume loss of the brain. 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 Punctate foci of susceptibility hypointensity compatible with hemosiderin deposition of chronic microhemorrhage are present scattered throughout the brain in a predominantly central distribution. Vascular: As below. Skull and upper cervical spine: Normal marrow signal. Sinuses/Orbits: Negative. Other: None. MRA HEAD FINDINGS Internal carotid arteries:  Patent. Anterior cerebral arteries:  Patent. Middle cerebral arteries: Patent. Anterior communicating artery: Not identified, likely hypoplastic or absent. Posterior communicating arteries:  Patent.  Fetal left PCA. Posterior cerebral arteries:  Patent. Basilar artery:  Patent. Vertebral arteries: Patent. Left dominant. Right vertebral artery terminates in right PICA. Persistent right trigeminal artery variant anatomy. No evidence of high-grade stenosis, large vessel occlusion, or aneurysm. IMPRESSION: MRI head: 1. 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 No associated hemorrhage or mass effect. Additional punctate focus in left anterior insula. 2. 12 mm subacute infarction within right brachium pontis. 3. Advanced chronic microvascular ischemic changes and moderate volume loss of the brain. 4. Multiple foci of chronic microhemorrhage with predominantly central distribution favoring sequelae of chronic hypertension. 5. Left paramedian frontoparietal region arachnoid cyst. MRA head: 1. No large vessel occlusion, aneurysm,  or significant stenosis is identified. 2. Incidental persistent right trigeminal artery noted. These results will be called to the ordering clinician or representative by the Radiologist Assistant, and communication documented in the PACS or zVision Dashboard. Electronically Signed   By: Mitzi Hansen M.D.   On:  08/18/2018 19:44   Vas US Carotid (at [facility])  Result Date: 08/19/2018 Carotid Arterial Duplex Study Indications: CVA. Limitations: limited visualization- shadowing Performing Technologist: Blanch Media RVS  Examination Guidelines: A complete evaluation includes B-mode imaging, spectral Doppler, color Doppler, and power Doppler as needed of all accessible portions of each vessel. Bilateral testing is considered an integral part of a complete examination. Limited examinations for reoccurring indications may be performed as noted.  Right Carotid Findings: +----------+--------+--------+--------+------------+--------+           PSV cm/sEDV cm/sStenosisDescribe    Comments +----------+--------+--------+--------+------------+--------+ CCA Prox  138     12              heterogenous         +----------+--------+--------+--------+------------+--------+ CCA Distal111     9               heterogenous         +----------+--------+--------+--------+------------+--------+ ICA Prox  80      16      1-39%   heterogenous         +----------+--------+--------+--------+------------+--------+ ICA Distal65      12                                   +----------+--------+--------+--------+------------+--------+ ECA       69                                           +----------+--------+--------+--------+------------+--------+ +----------+--------+-------+--------+-------------------+           PSV cm/sEDV cmsDescribeArm Pressure (mmHG) +----------+--------+-------+--------+-------------------+ [id number]                                        +----------+--------+-------+--------+-------------------+ +---------+--------+--+--------+-+---------+ VertebralPSV cm/s83EDV cm/s6Antegrade +---------+--------+--+--------+-+---------+  Left Carotid Findings: +----------+--------+--------+--------+------------+--------------+           PSV cm/sEDV cm/sStenosisDescribe     Comments       +----------+--------+--------+--------+------------+--------------+ CCA Prox  127     15              heterogenous               +----------+--------+--------+--------+------------+--------------+ CCA Distal125     11              heterogenous               +----------+--------+--------+--------+------------+--------------+ ICA Prox  34      6       1-39%   heterogenous               +----------+--------+--------+--------+------------+--------------+ ICA Distal                                    not visualized +----------+--------+--------+--------+------------+--------------+ ECA       92                                                 +----------+--------+--------+--------+------------+--------------+ +----------+--------+--------+--------+-------------------+  SubclavianPSV cm/sEDV cm/sDescribeArm Pressure (mmHG) +----------+--------+--------+--------+-------------------+           198                                         +----------+--------+--------+--------+-------------------+ +---------+--------+--+--------+-+---------+ VertebralPSV cm/s57EDV cm/s9Antegrade +---------+--------+--+--------+-+---------+  Summary: Right Carotid: Velocities in the right ICA are consistent with a 1-39% stenosis. Left Carotid: Velocities in the left ICA are consistent with a 1-39% stenosis. Vertebrals: Bilateral vertebral arteries demonstrate antegrade flow. *See table(s) above for measurements and observations.  Electronically signed by Delia Heady MD on 08/19/2018 at 2:26:32 PM.    Final     PHYSICAL EXAM Pleasant middle aged lady not in distress. . Afebrile. Head is nontraumatic. Neck is supple without bruit.    Cardiac exam no murmur or gallop. Lungs are clear to auscultation. Distal pulses are well felt. Neurological Exam :  Awake alert oriented 3. Severe dysarthria but can be understood. No aphasia. Follows commands well. Extraocular movements  are full range without nystagmus. She blinks to threat bilaterally. Moderate right lower facial weakness. Tongue midline. Right hemiplegia with right upper extremity 2/5 strength in right lower extremity 4/5 strength with drift. Normal strength on the left. Sensation is intact. Reflexes are depressed on the right normal on the left. Right plantar equivocal left downgoing. Gait not tested. ASSESSMENT/PLAN Ms. Catherine Boyd is a 62 y.o. female with history of HTN and DB presenting with L sided weakness, found down x 2 days.   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 Old L thalamic infarct. Prominent SVD. Atrophy.  MRI  Acute/subactue L basal ganglia and corona radiata infarct. Also small L anterior insular infarct. Subacute R brachium pontis infarct. Small vessel disease. Atrophy. Multiple foci of chronic micro hemorrhages . L paramedian FP arachnoid cyst.  MRA  No ELVO. Incidental persistent R trigeminal artery.  Carotid Doppler  B ICA 1-39% stenosis, VAs antegrade   2D Echo  EF 60-65%. No clot.  TEE normal  LDL 75  HgbA1c 6.6  Lovenox 30 mg sq daily for VTE prophylaxis  No antithrombotic prior to admission, now on aspirin 325 mg daily and clopidogrel 75 mg daily following aspirin and plavix load. Will continue aspirin alone. No need for additional plavix given NIHSS > 4  Therapy recommendations:  CLR  Disposition:  pending   Hypertension  Stable . Permissive hypertension (OK if < 220/120) but gradually normalize in 5-7 days . Long-term BP goal normotensive  Hyperlipidemia  Home meds:  lipitor 10  Increased to lipitor 80 on admission to hospital  LDL 75, goal < 70  Decrease lipitor to 40  Continue statin` at discharge  Diabetes type II  HgbA1c 6.6, goal < 7.0  Controlled  Other Stroke Risk Factors  ETOH use, advised to drink no more than 1 drink(s) a  day  Family hx stroke (mother)  Other Active Problems  AKI on CKD III  Leukocytosis 16.1  Hospital day # 2    She presented more than 2 days after onset of her stroke and has large left basal ganglia as well as smaller brainstem infarcts in etiology likely embolic. Continue aspirin.325mg  daily and await loop recorder.discussed with Dr. Jerral Ralph Greater than 50% time during this  25 minute visit was spent on counseling and coordination of care about her  embolic stroke and answered questions.  Delia HeadyPramod Burley Kopka, MD Medical Director [facility] Pager: [phone] 08/20/2018 2:39 PM  To contact Stroke Continuity provider, please refer to [email or web address]. After hours, contact General Neurology

## 2018-08-21 ENCOUNTER — Encounter (HOSPITAL_COMMUNITY): Payer: Self-pay | Admitting: Internal Medicine

## 2018-08-21 DIAGNOSIS — I639 Cerebral infarction, unspecified: Secondary | ICD-10-CM

## 2018-08-21 DIAGNOSIS — N179 Acute kidney failure, unspecified: Secondary | ICD-10-CM

## 2018-08-21 DIAGNOSIS — D72829 Elevated white blood cell count, unspecified: Secondary | ICD-10-CM

## 2018-08-21 DIAGNOSIS — T796XXA Traumatic ischemia of muscle, initial encounter: Secondary | ICD-10-CM

## 2018-08-21 DIAGNOSIS — E139 Other specified diabetes mellitus without complications: Secondary | ICD-10-CM

## 2018-08-21 DIAGNOSIS — I10 Essential (primary) hypertension: Secondary | ICD-10-CM

## 2018-08-21 LAB — COMPREHENSIVE METABOLIC PANEL
ALBUMIN: 3.1 g/dL — AB (ref 3.5–5.0)
ALK PHOS: 63 U/L (ref 38–126)
ALT: 36 U/L (ref 0–44)
ANION GAP: 7 (ref 5–15)
AST: 32 U/L (ref 15–41)
BUN: 24 mg/dL — ABNORMAL HIGH (ref 8–23)
CALCIUM: 8.1 mg/dL — AB (ref 8.9–10.3)
CHLORIDE: 108 mmol/L (ref 98–111)
CO2: 24 mmol/L (ref 22–32)
CREATININE: 1.27 mg/dL — AB (ref 0.44–1.00)
GFR calc Af Amer: 53 mL/min — ABNORMAL LOW (ref >60)
GFR calc non Af Amer: 46 mL/min — ABNORMAL LOW (ref >60)
Glucose, Bld: 137 mg/dL — ABNORMAL HIGH (ref 70–99)
Potassium: 3.8 mmol/L (ref 3.5–5.1)
Sodium: 139 mmol/L (ref 135–145)
Total Bilirubin: 0.8 mg/dL (ref 0.3–1.2)
Total Protein: 5.9 g/dL — ABNORMAL LOW (ref 6.5–8.1)

## 2018-08-21 LAB — GLUCOSE, CAPILLARY
Glucose-Capillary: 105 mg/dL — ABNORMAL HIGH (ref 70–99)
Glucose-Capillary: 265 mg/dL — ABNORMAL HIGH (ref 70–99)
Glucose-Capillary: 159 mg/dL — ABNORMAL HIGH (ref 70–99)
Glucose-Capillary: 138 mg/dL — ABNORMAL HIGH (ref 70–99)

## 2018-08-21 NOTE — Progress Notes (Signed)
STROKE TEAM PROGRESS NOTE   INTERVAL HISTORY Her brother in lawis at the bedside.   .unfortunately her mother is also admitted to the hospital patient is aware of this fact but does not know the details. TEE was negative. Cardiology refused loop recorder as patient has plans to move to [place] and recommend it be done there  Vitals:   08/21/18 0019 08/21/18 0547 08/21/18 0833 08/21/18 1130  BP: (!) 162/83 (!) 164/77 (!) 188/98 (!) 157/81  Pulse: 87 89 96 95  Resp: 20 20 20 20   Temp: 98.1 F (36.7 C) 98.1 F (36.7 C) 98.9 F (37.2 C) 98.9 F (37.2 C)  TempSrc: Oral Oral Oral Oral  SpO2: 99% 99% 99% 100%  Weight:      Height:        CBC:  Recent Labs  Lab 08/18/18 1051 08/18/18 1111  WBC  --  16.1*  NEUTROABS  --  13.4*  HGB 15.0 13.6  HCT 44.0 43.9  MCV  --  84.4  PLT  --  276    Basic Metabolic Panel:  Recent Labs  Lab 08/20/18 0639 08/21/18 0241  NA 142 139  K 3.9 3.8  CL 117* 108  CO2 21* 24  GLUCOSE 123* 137*  BUN 37* 24*  CREATININE 1.44* 1.27*  CALCIUM 7.7* 8.1*   Lipid Panel:     Component Value Date/Time   CHOL 153 08/19/2018 0739   TRIG 143 08/19/2018 0739   HDL 49 08/19/2018 0739   CHOLHDL 3.1 08/19/2018 0739   VLDL 29 08/19/2018 0739   LDLCALC 75 08/19/2018 0739   HgbA1c:  Lab Results  Component Value Date   HGBA1C 6.6 (H) 08/19/2018   Urine Drug Screen:     Component Value Date/Time   LABOPIA NONE DETECTED 08/19/2018 0735   COCAINSCRNUR NONE DETECTED 08/19/2018 0735   LABBENZ NONE DETECTED 08/19/2018 0735   AMPHETMU NONE DETECTED 08/19/2018 0735   THCU NONE DETECTED 08/19/2018 0735   LABBARB NONE DETECTED 08/19/2018 0735    Alcohol Level     Component Value Date/Time   ETH <10 08/18/2018 1111    IMAGING No results found.  PHYSICAL EXAM Pleasant middle aged lady not in distress. . Afebrile. Head is nontraumatic. Neck is supple without bruit.    Cardiac exam no murmur or gallop. Lungs are clear to auscultation. Distal pulses  are well felt. Neurological Exam :  Awake alert oriented 3. Severe dysarthria but can be understood. No aphasia. Follows commands well. Extraocular movements are full range without nystagmus. She blinks to threat bilaterally. Moderate right lower facial weakness. Tongue midline. Right hemiplegia with right upper extremity 2/5 strength in right lower extremity 4/5 strength with drift. Normal strength on the left. Sensation is intact. Reflexes are depressed on the right normal on the left. Right plantar equivocal left downgoing. Gait not tested. ASSESSMENT/PLAN Ms. Catherine Boyd is a 62 y.o. female with history of HTN and DB presenting with L sided weakness, found down x 2 days.   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 Old L thalamic infarct. Prominent SVD. Atrophy.  MRI  Acute/subactue L basal ganglia and corona radiata infarct. Also small L anterior insular infarct. Subacute R brachium pontis infarct. Small vessel disease. Atrophy. Multiple foci of chronic micro hemorrhages . L paramedian FP arachnoid cyst.  MRA  No ELVO. Incidental persistent R trigeminal artery.  Carotid Doppler  B  ICA 1-39% stenosis, VAs antegrade   2D Echo  EF 60-65%. No clot.  TEE normal  LDL 75  HgbA1c 6.6  Lovenox 30 mg sq daily for VTE prophylaxis  No antithrombotic prior to admission, now on aspirin 325 mg daily and clopidogrel 75 mg daily following aspirin and plavix load. Will continue aspirin alone. No need for additional plavix given NIHSS > 4  Therapy recommendations:  CLR  Disposition:  pending   Hypertension  Stable . Permissive hypertension (OK if < 220/120) but gradually normalize in 5-7 days . Long-term BP goal normotensive  Hyperlipidemia  Home meds:  lipitor 10  Increased to lipitor 80 on admission to hospital  LDL 75, goal < 70  Decrease lipitor to 40  Continue statin` at  discharge  Diabetes type II  HgbA1c 6.6, goal < 7.0  Controlled  Other Stroke Risk Factors  ETOH use, advised to drink no more than 1 drink(s) a day  Family hx stroke (mother)  Other Active Problems  AKI on CKD III  Leukocytosis 16.1  Hospital day # 3    She presented more than 2 days after onset of her stroke and has large left basal ganglia as well as smaller brainstem infarcts in etiology likely embolic. Continue aspirin.325mg  daily and   loop recorder refused by cardiology. Long discussion with patient and brother in law at the bedside about her condition and answered questions. Transfer to nursing facility for rehabilitation in the next couple of days.Marland Kitchen.discussed with Dr. Jerral RalphGhimire Greater than 50% time during this  25 minute visit was spent on counseling and coordination of care about her embolic stroke and answered questions.Stroke team will sign off. Kindly call for questions.  Catherine Boyd , MD Medical Director [facility] Pager: [phone] 08/21/2018 12:50 PM  To contact Stroke Continuity provider, please refer to [email or web address]. After hours, contact General Neurology

## 2018-08-21 NOTE — Evaluation (Signed)
Clinical/Bedside Swallow Evaluation Patient Details  Name: Catherine Boyd MRN: [id number] Date of Birth: 10/26/1955  Today's Date: 08/21/2018 Time: SLP Start Time (ACUTE ONLY): 1312 SLP Stop Time (ACUTE ONLY): 1334 SLP Time Calculation (min) (ACUTE ONLY): 22 min  Past Medical History:  Past Medical History:  Diagnosis Date  . Diabetes 1.5, managed as type 2 (HCC)   . Diabetes mellitus without complication (HCC)   . Hypertension    Past Surgical History: History reviewed. No pertinent surgical history. HPI:   62 y/o female with PMH significant for HTN, DM, presenting to ED after having been found down at home, having been down all weekend.  MRI showed Acute/early subacute infart within the L BG and corona radiata plus additional punctate focus in left anterior insula.   Pt reports occasional difficulty with swallowing.  Assessment / Plan / Recommendation Clinical Impression  Pt presents with a mild oral dysphagia secondary to decreased sensation with occasional oral spillage/retention in right lateral sulcus.  She consumed multiple, successive boluses of thin liquid with no s/s of aspiration.  Lung sounds are clear.  Pt appears to be tolerating current diet - primary issue is related to oral phase sensation.  SLP is following for speech/cognition (dysarthria is milder now than when evaluated on 11/26); will follow for mild dysphagia as well. D/W RN. Continue regular solids, thin liquids; give meds whole in puree.  SLP Visit Diagnosis: Dysphagia, oral phase (R13.11)    Aspiration Risk       Diet Recommendation   regular solids; thin liquids  Medication Administration: Whole meds with puree    Other  Recommendations Oral Care Recommendations: Oral care BID   Follow up Recommendations Inpatient Rehab      Frequency and Duration min 2x/week  1 week       Prognosis        Swallow Study   General Date of Onset: 08/18/18 HPI:  62 y/o female with PMH significant for HTN, DM,  presenting to ED after having been found down at home, having been down all weekend.  MRI showed Acute/early subacute infart within the L BG and corona radiate plus additional punctate focus in left anterior insula.   Type of Study: Bedside Swallow Evaluation Previous Swallow Assessment: no Diet Prior to this Study: Regular;Thin liquids Temperature Spikes Noted: No Respiratory Status: Room air History of Recent Intubation: No Behavior/Cognition: Alert;Cooperative;Pleasant mood Oral Cavity Assessment: Within Functional Limits Oral Care Completed by SLP: No Oral Cavity - Dentition: Adequate natural dentition Vision: Functional for self-feeding Self-Feeding Abilities: Able to feed self Patient Positioning: Upright in bed Baseline Vocal Quality: Normal Volitional Cough: Strong Volitional Swallow: Able to elicit    Oral/Motor/Sensory Function Overall Oral Motor/Sensory Function: Mild impairment Facial ROM: Reduced right Facial Symmetry: Abnormal symmetry right;Suspected CN VII (facial) dysfunction Facial Strength: Reduced right   Ice Chips Ice chips: Within functional limits   Thin Liquid Thin Liquid: Within functional limits Presentation: Cup;Straw    Nectar Thick Nectar Thick Liquid: Not tested   Honey Thick Honey Thick Liquid: Not tested   Puree Puree: Impaired Presentation: Spoon Oral Phase Functional Implications: Oral residue(mild right)   Solid     Solid: Impaired Presentation: Self Fed Oral Phase Functional Implications: Oral residue(mild right)      Catherine Boyd, Catherine Boyd 08/21/2018,1:48 PM   Kendrik Mcshan L. Samson Fredericouture, MA CCC/SLP Acute Rehabilitation Services Office number [phone] Pager [phone]

## 2018-08-21 NOTE — Progress Notes (Signed)
PROGRESS NOTE        PATIENT DETAILS Name: Catherine Boyd Age: 63 y.o. Sex: female Date of Birth: 06-22-1956 Admit Date: 08/18/2018 Admitting Physician Elyse Hsu, MD ZOX:WRUEAV, Windy Fast, MD  Brief Narrative: Patient is a 62 y.o. female with history of DM-2, dyslipidemia, hypertension-to the emergency room on 11/25 after being found down at home-further evaluation revealed a acute CVA and mild rhabdomyolysis.  See below for further details  Subjective: Has unchanged right-sided weakness.  Denies any chest pain or shortness of breath.  Coughing after sips of water today.  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  Carotid Doppler without any major stenosis.  TEE negative for thrombus.  Telemetry negative for A. fib.  Family did not want to pursue a loop recorder as they are leaning on taking the patient to [place] where she will be closer to family.  A1c 6.6, LDL 75.  Neurology recommending-aspirin at this time.  Rhabdomyolysis: Secondary to being down-appears very mild.  CK has already down trended with supportive care.   AKI: Hemodynamically mediated-improving with supportive care. e.  Hypertension: Permissive hypertension-continue amlodipine-stop all IV fluids.  Follow.   Dyslipidemia: Continue statin.  DM-2: CBG stable-tinea SSI-resume oral hypoglycemics on discharge.    DVT Prophylaxis: Prophylactic Lovenox   Code Status: Full code   Family Communication: Sister at bedside  Disposition Plan: Remain inpatient and CIR or SNF on discharge  Antimicrobial agents: Anti-infectives (From admission, onward)   None      Procedures: None  CONSULTS:  neurology  Time spent: 25- minutes-Greater than 50% of this time was spent in counseling, explanation of diagnosis, planning of further management, and coordination of care.  MEDICATIONS: Scheduled Meds: .  stroke: mapping our  early stages of recovery book   Does not apply Once  . amLODipine  5 mg Oral Daily  . aspirin  325 mg Oral Daily  . atorvastatin  40 mg Oral Daily  . enoxaparin (LOVENOX) injection  40 mg Subcutaneous Q24H  . insulin aspart  0-15 Units Subcutaneous TID WC  . insulin aspart  0-5 Units Subcutaneous QHS   Continuous Infusions: . sodium chloride 50 mL/hr at 08/20/18 1509   PRN Meds:.acetaminophen **OR** acetaminophen (TYLENOL) oral liquid 160 mg/5 mL **OR** acetaminophen, senna-docusate, white petrolatum   PHYSICAL EXAM: Vital signs: Vitals:   08/20/18 2216 08/21/18 0019 08/21/18 0547 08/21/18 0833  BP: (!) 148/75 (!) 162/83 (!) 164/77 (!) 188/98  Pulse: 92 87 89 96  Resp: 20 20 20 20   Temp: 99.1 F (37.3 C) 98.1 F (36.7 C) 98.1 F (36.7 C) 98.9 F (37.2 C)  TempSrc: Oral Oral Oral Oral  SpO2: 97% 99% 99% 99%  Weight:      Height:       Filed Weights   08/18/18 2153 08/20/18 1135  Weight: 85.5 kg 85.5 kg   Body mass index is 27.84 kg/m.   General appearance:Awake, alert, not in any distress.  Eyes:no scleral icterus. HEENT: Atraumatic and Normocephalic Neck: supple, no JVD. Resp:Good air entry bilaterally,no rales or rhonchi CVS: S1 S2 regular, no murmurs.  GI: Bowel sounds present, Non tender and not distended with no gaurding, rigidity or rebound. Extremities: B/L Lower Ext shows no edema, both legs are warm to touch Neurology: Right upper extremity 2/5, right lower extremity 3/5.  Psychiatric: Normal judgment and insight. Normal mood. Musculoskeletal:No digital cyanosis Skin:No Rash, warm and dry Wounds:N/A  I have personally reviewed following labs and imaging studies  LABORATORY DATA: CBC: Recent Labs  Lab 08/18/18 1051 08/18/18 1111  WBC  --  16.1*  NEUTROABS  --  13.4*  HGB 15.0 13.6  HCT 44.0 43.9  MCV  --  84.4  PLT  --  276    Basic Metabolic Panel: Recent Labs  Lab 08/18/18 1051 08/18/18 1111 08/19/18 0739 08/20/18 0639 08/21/18 0241   NA 138 141 143 142 139  K 4.6 4.2 3.8 3.9 3.8  CL 107 103 114* 117* 108  CO2  --  25 20* 21* 24  GLUCOSE 197* 190* 110* 123* 137*  BUN 65* 61* 55* 37* 24*  CREATININE 1.70* 1.88* 1.69* 1.44* 1.27*  CALCIUM  --  9.1 8.1* 7.7* 8.1*    GFR: Estimated Creatinine Clearance: 54.3 mL/min (A) (by C-G formula based on SCr of 1.27 mg/dL (H)).  Liver Function Tests: Recent Labs  Lab 08/18/18 1111 08/19/18 0739 08/20/18 0639 08/21/18 0241  AST 46* 35 31 32  ALT 77* 49* 39 36  ALKPHOS 89 63 62 63  BILITOT 1.1 1.1 0.8 0.8  PROT 7.6 5.8* 5.4* 5.9*  ALBUMIN 4.0 3.0* 2.7* 3.1*   No results for input(s): LIPASE, AMYLASE in the last 168 hours. No results for input(s): AMMONIA in the last 168 hours.  Coagulation Profile: Recent Labs  Lab 08/18/18 1111  INR 1.12    Cardiac Enzymes: Recent Labs  Lab 08/18/18 1339 08/19/18 0739 08/20/18 0639  CKTOTAL 1,154* 894* 666*    BNP (last 3 results) No results for input(s): PROBNP in the last 8760 hours.  HbA1C: Recent Labs    08/19/18 0739  HGBA1C 6.6*    CBG: Recent Labs  Lab 08/19/18 2117 08/20/18 0601 08/20/18 1609 08/20/18 2124 08/21/18 0645  GLUCAP 189* 119* 98 168* 105*    Lipid Profile: Recent Labs    08/19/18 0739  CHOL 153  HDL 49  LDLCALC 75  TRIG 143  CHOLHDL 3.1    Thyroid Function Tests: No results for input(s): TSH, T4TOTAL, FREET4, T3FREE, THYROIDAB in the last 72 hours.  Anemia Panel: No results for input(s): VITAMINB12, FOLATE, FERRITIN, TIBC, IRON, RETICCTPCT in the last 72 hours.  Urine analysis:    Component Value Date/Time   COLORURINE YELLOW 08/19/2018 1935   APPEARANCEUR HAZY (A) 08/19/2018 1935   LABSPEC 1.026 08/19/2018 1935   PHURINE 5.0 08/19/2018 1935   GLUCOSEU NEGATIVE 08/19/2018 1935   HGBUR NEGATIVE 08/19/2018 1935   BILIRUBINUR NEGATIVE 08/19/2018 1935   KETONESUR 5 (A) 08/19/2018 1935   PROTEINUR NEGATIVE 08/19/2018 1935   NITRITE NEGATIVE 08/19/2018 1935    LEUKOCYTESUR TRACE (A) 08/19/2018 1935    Sepsis Labs: Lactic Acid, Venous No results found for: LATICACIDVEN  MICROBIOLOGY: No results found for this or any previous visit (from the past 240 hour(s)).  RADIOLOGY STUDIES/RESULTS: Ct Head Wo Contrast  Result Date: 08/18/2018 CLINICAL DATA:  62 year old female found down.  Initial encounter. EXAM: CT HEAD WITHOUT CONTRAST TECHNIQUE: Contiguous axial images were obtained from the base of the skull through the vertex without intravenous contrast. COMPARISON:  None. FINDINGS: Brain: Acute nonhemorrhagic infarct suspected involving the left corona radiata/lenticular nucleus. Remote small anteromedial left thalamic infarct. Prominent chronic microvascular changes. No intracranial hemorrhage. Global atrophy. Slight asymmetry lateral ventricles without mass seen causing this appearance. The ventricular size is most likely related to atrophy rather hydrocephalus.  Aqueduct is patent. No intracranial mass lesion noted on this unenhanced exam. Vascular: Vascular calcifications. Intracranial vasculature is ectatic with calcified plaque cavernous segment internal carotid arteries and portion of vertebral arteries. Skull: No skull fracture Sinuses/Orbits: No acute orbital abnormality. Visualized paranasal sinuses are clear. Other: Mastoid air cells and middle ear cavities are clear. IMPRESSION: 1. Acute nonhemorrhagic infarct suspected involving the left corona radiata/lenticular nucleus. 2. Remote small anteromedial left thalamic infarct. 3. Prominent chronic microvascular changes. 4. No intracranial hemorrhage. 5. Global atrophy. These results were called by telephone at the time of interpretation on 08/18/2018 at 12:31 pm to Dr. Azalia Bilis , who verbally acknowledged these results. Electronically Signed   By: Lacy Duverney M.D.   On: 08/18/2018 12:40   Mr Brain Wo Contrast  Result Date: 08/18/2018 CLINICAL DATA:  62 y/o  F; found down.  Stroke follow-up.  EXAM: MRI HEAD WITHOUT CONTRAST MRA HEAD WITHOUT CONTRAST TECHNIQUE: Multiplanar, multiecho pulse sequences of the brain and surrounding structures were obtained without intravenous contrast. Angiographic images of the head were obtained using MRA technique without contrast. COMPARISON:  08/18/2018 CT head. FINDINGS: MRI HEAD FINDINGS Brain: Focus of reduced diffusion of left putamen, corona radiata, and caudate body measuring 2.5 x 1.2 x 2.5 cm (volume = 3.9 cm^3) (AP x ML x CC series 3, image 38 and series 5, image 17) compatible with acute/early subacute infarction. Additional punctate focus of reduced diffusion within the right anterior insula. 12 mm focus of intermediate diffusion within the right brachium pontis compatible with subacute infarct (series 3, image 19). Patchy comp nonspecific T2 FLAIR hyperintensities in subcortical and periventricular white matter as well as the pons are compatible with advanced chronic microvascular ischemic changes for age. Moderate to severe volume loss of the brain. 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 Punctate foci of susceptibility hypointensity compatible with hemosiderin deposition of chronic microhemorrhage are present scattered throughout the brain in a predominantly central distribution. Vascular: As below. Skull and upper cervical spine: Normal marrow signal. Sinuses/Orbits: Negative. Other: None. MRA HEAD FINDINGS Internal carotid arteries:  Patent. Anterior cerebral arteries:  Patent. Middle cerebral arteries: Patent. Anterior communicating artery: Not identified, likely hypoplastic or absent. Posterior communicating arteries:  Patent.  Fetal left PCA. Posterior cerebral arteries:  Patent. Basilar artery:  Patent. Vertebral arteries: Patent. Left dominant. Right vertebral artery terminates in right PICA. Persistent  right trigeminal artery variant anatomy. No evidence of high-grade stenosis, large vessel occlusion, or aneurysm. IMPRESSION: MRI head: 1. 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 No associated hemorrhage or mass effect. Additional punctate focus in left anterior insula. 2. 12 mm subacute infarction within right brachium pontis. 3. Advanced chronic microvascular ischemic changes and moderate volume loss of the brain. 4. Multiple foci of chronic microhemorrhage with predominantly central distribution favoring sequelae of chronic hypertension. 5. Left paramedian frontoparietal region arachnoid cyst. MRA head: 1. No large vessel occlusion, aneurysm, or significant stenosis is identified. 2. Incidental persistent right trigeminal artery noted. These results will be called to the ordering clinician or representative by the Radiologist Assistant, and communication documented in the PACS or zVision Dashboard. Electronically Signed   By: Mitzi Hansen M.D.   On: 08/18/2018 19:44   Mr Maxine Glenn Head Wo Contrast  Result Date: 08/18/2018 CLINICAL DATA:  62  y/o  F; found down.  Stroke follow-up. EXAM: MRI HEAD WITHOUT CONTRAST MRA HEAD WITHOUT CONTRAST TECHNIQUE: Multiplanar, multiecho pulse sequences of the brain and surrounding structures were obtained without intravenous contrast. Angiographic images of the head were obtained using MRA technique without contrast. COMPARISON:  08/18/2018 CT head. FINDINGS: MRI HEAD FINDINGS Brain: Focus of reduced diffusion of left putamen, corona radiata, and caudate body measuring 2.5 x 1.2 x 2.5 cm (volume = 3.9 cm^3) (AP x ML x CC series 3, image 38 and series 5, image 17) compatible with acute/early subacute infarction. Additional punctate focus of reduced diffusion within the right anterior insula. 12 mm focus of intermediate diffusion within the right brachium pontis compatible with subacute infarct  (series 3, image 19). Patchy comp nonspecific T2 FLAIR hyperintensities in subcortical and periventricular white matter as well as the pons are compatible with advanced chronic microvascular ischemic changes for age. Moderate to severe volume loss of the brain. 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 Punctate foci of susceptibility hypointensity compatible with hemosiderin deposition of chronic microhemorrhage are present scattered throughout the brain in a predominantly central distribution. Vascular: As below. Skull and upper cervical spine: Normal marrow signal. Sinuses/Orbits: Negative. Other: None. MRA HEAD FINDINGS Internal carotid arteries:  Patent. Anterior cerebral arteries:  Patent. Middle cerebral arteries: Patent. Anterior communicating artery: Not identified, likely hypoplastic or absent. Posterior communicating arteries:  Patent.  Fetal left PCA. Posterior cerebral arteries:  Patent. Basilar artery:  Patent. Vertebral arteries: Patent. Left dominant. Right vertebral artery terminates in right PICA. Persistent right trigeminal artery variant anatomy. No evidence of high-grade stenosis, large vessel occlusion, or aneurysm. IMPRESSION: MRI head: 1. 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 No associated hemorrhage or mass effect. Additional punctate focus in left anterior insula. 2. 12 mm subacute infarction within right brachium pontis. 3. Advanced chronic microvascular ischemic changes and moderate volume loss of the brain. 4. Multiple foci of chronic microhemorrhage with predominantly central distribution favoring sequelae of chronic hypertension. 5. Left paramedian frontoparietal region arachnoid cyst. MRA head: 1. No large vessel occlusion, aneurysm, or significant stenosis is  identified. 2. Incidental persistent right trigeminal artery noted. These results will be called to the ordering clinician or representative by the Radiologist Assistant, and communication documented in the PACS or zVision Dashboard. Electronically Signed   By: Mitzi HansenLance  Furusawa-Stratton M.D.   On: 08/18/2018 19:44   [facility] [place]  Result Date: 08/19/2018 Carotid Arterial Duplex Study Indications: CVA. Limitations: limited visualization- shadowing Performing Technologist: Blanch MediaMegan Riddle RVS  Examination Guidelines: A complete evaluation includes B-mode imaging, spectral Doppler, color Doppler, and power Doppler as needed of all accessible portions of each vessel. Bilateral testing is considered an integral part of a complete examination. Limited examinations for reoccurring indications may be performed as noted.  Right Carotid Findings: +----------+--------+--------+--------+------------+--------+           PSV cm/sEDV cm/sStenosisDescribe    Comments +----------+--------+--------+--------+------------+--------+ CCA Prox  138     12              heterogenous         +----------+--------+--------+--------+------------+--------+ CCA Distal111     9               heterogenous         +----------+--------+--------+--------+------------+--------+  ICA Prox  80      16      1-39%   heterogenous         +----------+--------+--------+--------+------------+--------+ ICA Distal65      12                                   +----------+--------+--------+--------+------------+--------+ ECA       69                                           +----------+--------+--------+--------+------------+--------+ +----------+--------+-------+--------+-------------------+           PSV cm/sEDV cmsDescribeArm Pressure (mmHG) +----------+--------+-------+--------+-------------------+ [id number]                                         +----------+--------+-------+--------+-------------------+ +---------+--------+--+--------+-+---------+ VertebralPSV cm/s83EDV cm/s6Antegrade +---------+--------+--+--------+-+---------+  Left Carotid Findings: +----------+--------+--------+--------+------------+--------------+           PSV cm/sEDV cm/sStenosisDescribe    Comments       +----------+--------+--------+--------+------------+--------------+ CCA Prox  127     15              heterogenous               +----------+--------+--------+--------+------------+--------------+ CCA Distal125     11              heterogenous               +----------+--------+--------+--------+------------+--------------+ ICA Prox  34      6       1-39%   heterogenous               +----------+--------+--------+--------+------------+--------------+ ICA Distal                                    not visualized +----------+--------+--------+--------+------------+--------------+ ECA       92                                                 +----------+--------+--------+--------+------------+--------------+ +----------+--------+--------+--------+-------------------+ SubclavianPSV cm/sEDV cm/sDescribeArm Pressure (mmHG) +----------+--------+--------+--------+-------------------+           198                                         +----------+--------+--------+--------+-------------------+ +---------+--------+--+--------+-+---------+ VertebralPSV cm/s57EDV cm/s9Antegrade +---------+--------+--+--------+-+---------+  Summary: Right Carotid: Velocities in the right ICA are consistent with a 1-39% stenosis. Left Carotid: Velocities in the left ICA are consistent with a 1-39% stenosis. Vertebrals: Bilateral vertebral arteries demonstrate antegrade flow. *See table(s) above for measurements and observations.  Electronically signed by Delia Heady MD on 08/19/2018 at 2:26:32 PM.    Final      LOS: 3 days   Jeoffrey Massed, MD  [facility]  If 7PM-7AM, please contact night-coverage  Please page via [email or web address]-Password [email or web address]-click on MD name and type text message  08/21/2018, 9:47 AM

## 2018-08-21 NOTE — Consult Note (Signed)
Physical Medicine and Rehabilitation Consult Reason for Consult: Weakness Referring Physician: Maretta BeesGhimire, Shanker M, MD   HPI: Catherine Boyd is a 62 y.o. female with past medical history of hypertension, LADA presented on 08/18/2018 with b/l CVA.  History taken from chart review.  Patient was found down at home.  Patient use the restroom on Friday and when she attempted to return noted paralysis.  She was found by a colleague after she did not show up for work on Monday.  In the ED she was noted to have AKI and leukocytosis.  Neurology and cardiology were consulted.  CT ordered, reviewed, showing left CVA.  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  MRI showed acute/subacute left basal ganglia and corona radiata infarct with small left anterior insular infarct and subacute right brachium pontis infarct as well as multiple foci of chronic microhemorrhages.  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  Carotid Doppler showed bilateral 1-39% ICA stenosis. Echocardiogram performed showed ejection fraction of 60-65%.  Hospital course further complicated by rhabdomyolysis.  Patient states she plans to return to her sister's place, who works during the day.   Review of Systems  Gastrointestinal: Positive for constipation.  Neurological: Positive for speech change and focal weakness. Negative for sensory change.  All other systems reviewed and are negative. ?  Reliability  Past Medical History:  Diagnosis Date  . Diabetes 1.5, managed as type 2 (HCC)   . Diabetes mellitus without complication (HCC)   . Hypertension    History reviewed. No pertinent surgical history. Family History  Problem Relation Age of Onset  . Stroke Mother   . Stroke Father   . Hypertension Father   . Diabetes Paternal Uncle    Social History:  reports that she has never smoked. She has never used smokeless tobacco. She reports that she drinks about  1.0 standard drinks of alcohol per week. She reports that she does not use drugs. Allergies:  Allergies  Allergen Reactions  . Penicillins Other (See Comments)    fainted   Facility-Administered Medications Prior to Admission  Medication Dose Route Frequency Provider Last Rate Last Dose  . Bevacizumab (AVASTIN) SOLN 1.25 mg  1.25 mg Intravitreal  Rennis ChrisZamora, Brian, MD   1.25 mg at 05/05/18 1625  . Bevacizumab (AVASTIN) SOLN 1.25 mg  1.25 mg Intravitreal  Rennis ChrisZamora, Brian, MD   1.25 mg at 06/04/18 2331  . Bevacizumab (AVASTIN) SOLN 1.25 mg  1.25 mg Intravitreal  Rennis ChrisZamora, Brian, MD   1.25 mg at 07/09/18 0151   Medications Prior to Admission  Medication Sig Dispense Refill  . atorvastatin (LIPITOR) 10 MG tablet Take 10 mg by mouth daily.    Marland Kitchen. glimepiride (AMARYL) 2 MG tablet Take 2 mg by mouth daily with breakfast.    . losartan (COZAAR) 50 MG tablet Take 50 mg by mouth daily.      Home: Home Living Family/patient expects to be discharged to:: Private residence Living Arrangements: Alone Available Help at Discharge: Family, Friend(s), Available PRN/intermittently(reports she may stay with sister after discharge initially) Type of Home: House Home Access: Stairs to enter Secretary/administratorntrance Stairs-Number of Steps: 2 Entrance Stairs-Rails: Right, Left Home Layout: Two level Alternate Level Stairs-Number of Steps: flight Alternate Level Stairs-Rails: Right, Left Bathroom Shower/Tub: Engineer, manufacturing systemsTub/shower unit Bathroom Toilet: Standard Home Equipment: None  Lives With: Alone  Functional History: Prior Function Level of Independence: Independent 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 Functional Status:  Mobility: Bed Mobility Overal bed mobility: Needs Assistance Bed Mobility: Supine to Sit Supine to sit: Mod assist General bed mobility comments: lighter mod assist needed to come up via stroner L elbow.  Pt needed stability assist at the trunk to give her time to assist with the L  UE Transfers Overall transfer level: Needs assistance Equipment used: Rolling walker (2 wheeled) Transfers: Sit to/from Stand Sit to Stand: Max assist Stand pivot transfers: Max assist, Mod assist General transfer comment: needed assist to come forward, then boost Ambulation/Gait Ambulation/Gait assistance: Max assist, Mod assist, +2 physical assistance, +2 safety/equipment Gait Distance (Feet): 5 Feet(foreward and back) Assistive device: Rolling walker (2 wheeled) Gait Pattern/deviations: Decreased step length - right, Decreased step length - left, Decreased stance time - left General Gait Details: pt taking extremely small steps.  Pt needing w/shift and holds, but in order to advance LE's more than and inch or 2, pt needed assist advancing either LE. Gait velocity: extremely slow    ADL: ADL Overall ADL's : Needs assistance/impaired Eating/Feeding: Set up, Minimal assistance, Sitting Grooming: Minimal assistance, Sitting Upper Body Bathing: Moderate assistance, Sitting Lower Body Bathing: Maximal assistance, Sit to/from stand Upper Body Dressing : Moderate assistance, Sitting Lower Body Dressing: Maximal assistance, Sit to/from stand Functional mobility during ADLs: Maximal assistance(sit<>stand only this session) General ADL Comments: pt with decreased RUE use, cognitive impairments, and fatigue with activity  Cognition: Cognition Overall Cognitive Status: Impaired/Different from baseline Arousal/Alertness: Awake/alert Orientation Level: Oriented X4 Attention: Sustained Sustained Attention: Impaired Sustained Attention Impairment: Verbal basic, Functional basic Memory: Impaired Memory Impairment: Retrieval deficit, Decreased recall of new information, Decreased short term memory Decreased Short Term Memory: Verbal basic, Functional basic Awareness: Impaired Awareness Impairment: Intellectual impairment, Anticipatory impairment Problem Solving: Impaired Problem Solving  Impairment: Verbal basic, Functional basic Executive Function: Organizing, Reasoning Reasoning: Impaired Reasoning Impairment: Verbal basic, Functional basic Organizing: Impaired Organizing Impairment: Verbal basic, Functional basic Behaviors: Perseveration, Verbal agitation Safety/Judgment: Impaired Cognition Arousal/Alertness: Awake/alert Behavior During Therapy: WFL for tasks assessed/performed, Flat affect Overall Cognitive Status: Impaired/Different from baseline Area of Impairment: Attention, Following commands, Awareness, Problem solving Current Attention Level: Sustained Following Commands: Follows one step commands consistently, Follows one step commands with increased time Problem Solving: Slow processing, Decreased initiation, Difficulty sequencing, Requires verbal cues, Requires tactile cues General Comments: pt easily distracted with family arriving during session; requires multimodal cues  Blood pressure (!) 188/98, pulse 96, temperature 98.9 F (37.2 C), temperature source Oral, resp. rate 20, height 5\' 9"  (1.753 m), weight 85.5 kg, SpO2 99 %. Physical Exam  Constitutional: She appears well-developed and well-nourished.  HENT:  Head: Normocephalic and atraumatic.  Eyes: EOM are normal. Right eye exhibits no discharge. Left eye exhibits no discharge.  Neck: Normal range of motion. Neck supple.  Cardiovascular: Normal rate and regular rhythm.  Respiratory: Effort normal and breath sounds normal.  GI: Soft. Bowel sounds are normal.  Musculoskeletal:  Generalized distal extremity edema  Neurological:  Alert and oriented x2 Global aphasia Motor: Left upper extremity: 4-/5 proximal distal Right upper extremity: 0/5 proximal distal Right lower extremity: 2/5 proximal distal Left lower extremity: 3-/5 proximal distal  Skin: Skin is warm and dry.  Psychiatric: Her affect is blunt. Her speech is delayed and slurred. She is slowed. Cognition and memory are impaired.     Results for orders placed or performed during the hospital encounter of 08/18/18 (from the past 24 hour(s))  Glucose, capillary     Status: None   Collection Time: 08/20/18  4:09 PM  Result Value Ref Range   Glucose-Capillary 98 70 - 99 mg/dL   Comment 1 Notify RN    Comment 2 Document in Chart   Glucose, capillary     Status: Abnormal   Collection Time: 08/20/18  9:24 PM  Result Value Ref Range   Glucose-Capillary 168 (H) 70 - 99 mg/dL  Comprehensive metabolic panel     Status: Abnormal   Collection Time: 08/21/18  2:41 AM  Result Value Ref Range   Sodium 139 135 - 145 mmol/L   Potassium 3.8 3.5 - 5.1 mmol/L   Chloride 108 98 - 111 mmol/L   CO2 24 22 - 32 mmol/L   Glucose, Bld 137 (H) 70 - 99 mg/dL   BUN 24 (H) 8 - 23 mg/dL   Creatinine, Ser 1.61 (H) 0.44 - 1.00 mg/dL   Calcium 8.1 (L) 8.9 - 10.3 mg/dL   Total Protein 5.9 (L) 6.5 - 8.1 g/dL   Albumin 3.1 (L) 3.5 - 5.0 g/dL   AST 32 15 - 41 U/L   ALT 36 0 - 44 U/L   Alkaline Phosphatase 63 38 - 126 U/L   Total Bilirubin 0.8 0.3 - 1.2 mg/dL   GFR calc non Af Amer 46 (L) >60 mL/min   GFR calc Af Amer 53 (L) >60 mL/min   Anion gap 7 5 - 15  Glucose, capillary     Status: Abnormal   Collection Time: 08/21/18  6:45 AM  Result Value Ref Range   Glucose-Capillary 105 (H) 70 - 99 mg/dL   Vas US Carotid (at [facility])  Result Date: 08/19/2018 Carotid Arterial Duplex Study Indications: CVA. Limitations: limited visualization- shadowing Performing Technologist: Blanch Media RVS  Examination Guidelines: A complete evaluation includes B-mode imaging, spectral Doppler, color Doppler, and power Doppler as needed of all accessible portions of each vessel. Bilateral testing is considered an integral part of a complete examination. Limited examinations for reoccurring indications may be performed as noted.  Right Carotid Findings: +----------+--------+--------+--------+------------+--------+           PSV cm/sEDV  cm/sStenosisDescribe    Comments +----------+--------+--------+--------+------------+--------+ CCA Prox  138     12              heterogenous         +----------+--------+--------+--------+------------+--------+ CCA Distal111     9               heterogenous         +----------+--------+--------+--------+------------+--------+ ICA Prox  80      16      1-39%   heterogenous         +----------+--------+--------+--------+------------+--------+ ICA Distal65      12                                   +----------+--------+--------+--------+------------+--------+ ECA       69                                           +----------+--------+--------+--------+------------+--------+ +----------+--------+-------+--------+-------------------+           PSV cm/sEDV cmsDescribeArm Pressure (mmHG) +----------+--------+-------+--------+-------------------+ [id number]                                        +----------+--------+-------+--------+-------------------+ +---------+--------+--+--------+-+---------+  VertebralPSV cm/s83EDV cm/s6Antegrade +---------+--------+--+--------+-+---------+  Left Carotid Findings: +----------+--------+--------+--------+------------+--------------+           PSV cm/sEDV cm/sStenosisDescribe    Comments       +----------+--------+--------+--------+------------+--------------+ CCA Prox  127     15              heterogenous               +----------+--------+--------+--------+------------+--------------+ CCA Distal125     11              heterogenous               +----------+--------+--------+--------+------------+--------------+ ICA Prox  34      6       1-39%   heterogenous               +----------+--------+--------+--------+------------+--------------+ ICA Distal                                    not visualized +----------+--------+--------+--------+------------+--------------+ ECA       92                                                  +----------+--------+--------+--------+------------+--------------+ +----------+--------+--------+--------+-------------------+ SubclavianPSV cm/sEDV cm/sDescribeArm Pressure (mmHG) +----------+--------+--------+--------+-------------------+           198                                         +----------+--------+--------+--------+-------------------+ +---------+--------+--+--------+-+---------+ VertebralPSV cm/s57EDV cm/s9Antegrade +---------+--------+--+--------+-+---------+  Summary: Right Carotid: Velocities in the right ICA are consistent with a 1-39% stenosis. Left Carotid: Velocities in the left ICA are consistent with a 1-39% stenosis. Vertebrals: Bilateral vertebral arteries demonstrate antegrade flow. *See table(s) above for measurements and observations.  Electronically signed by Delia Heady MD on 08/19/2018 at 2:26:32 PM.    Final     Assessment/Plan: Diagnosis: Bilateral, left greater than right infarcts Labs and images (see above) independently reviewed.  Records reviewed and summated above. Stroke: Continue secondary stroke prophylaxis and Risk Factor Modification listed below:   Antiplatelet therapy:   Blood Pressure Management:  Continue current medication with prn's with permisive HTN per primary team Statin Agent:   Diabetes management:   Quadriparesis: fit for orthosis to prevent contractures (resting hand splint for day, wrist cock up splint at night, PRAFO, etc) PT/OT for mobility, ADL training  Motor recovery: Fluoxetine  1. Does the need for close, 24 hr/day medical supervision in concert with the patient's rehab needs make it unreasonable for this patient to be served in a less intensive setting? Yes  2. Co-Morbidities requiring supervision/potential complications: rhabdomyolysis (continue aggressive hydration), leukocytosis (repeat labs, cont to monitor for signs and symptoms of infection, further workup if  indicated), AKI (avoid nephrotoxic meds), HTN (monitor and provide prns in accordance with increased physical exertion and pain), LADA (Monitor in accordance with exercise and adjust meds as necessary) 3. Due to bladder management, safety, disease management, medication administration and patient education, does the patient require 24 hr/day rehab nursing? Yes 4. Does the patient require coordinated care of a physician, rehab nurse, PT (1-2 hrs/day, 5 days/week), OT (1-2 hrs/day, 5 days/week) and SLP (1-2 hrs/day, 5 days/week) to address physical and functional deficits  in the context of the above medical diagnosis(es)? Yes Addressing deficits in the following areas: balance, endurance, locomotion, strength, transferring, bathing, dressing, feeding, grooming, toileting, cognition, speech, language and psychosocial support 5. Can the patient actively participate in an intensive therapy program of at least 3 hrs of therapy per day at least 5 days per week? Yes 6. The potential for patient to make measurable gains while on inpatient rehab is excellent 7. Anticipated functional outcomes upon discharge from inpatient rehab are min assist  with PT, min assist with OT, modified independent and supervision with SLP. 8. Estimated rehab length of stay to reach the above functional goals is: 20-25 days. 9. Anticipated D/C setting: SNF 10. Anticipated post D/C treatments: SNF 11. Overall Rehab/Functional Prognosis: good  RECOMMENDATIONS: This patient's condition is appropriate for continued rehabilitative care in the following setting: Patient appears to indicate that she does not have caregiver support at discharge and will require assistance at discharge.  CIR if caregiver support available upon discharge. Patient has agreed to participate in recommended program. Potentially Note that insurance prior authorization may be required for reimbursement for recommended care.  Comment: Rehab Admissions Coordinator  to follow up.   Maryla Morrow, MD, ABPMR 08/21/2018

## 2018-08-22 LAB — GLUCOSE, CAPILLARY
Glucose-Capillary: 123 mg/dL — ABNORMAL HIGH (ref 70–99)
Glucose-Capillary: 154 mg/dL — ABNORMAL HIGH (ref 70–99)
Glucose-Capillary: 190 mg/dL — ABNORMAL HIGH (ref 70–99)
Glucose-Capillary: 166 mg/dL — ABNORMAL HIGH (ref 70–99)

## 2018-08-22 NOTE — Clinical Social Work Note (Signed)
Clinical Social Work Assessment  Patient Details  Name: Catherine Boyd MRN: [id number] Date of Birth: 08/02/1956  Date of referral:  08/22/18               Reason for consult:  Facility Placement                Permission sought to share information with:  Facility Medical sales representativeContact Representative, Family Supports Permission granted to share information::  No  Name::     Catherine Boyd  Agency::  SNFs  Relationship::  Sister  Contact Information:  [phone]  Housing/Transportation Living arrangements for the past 2 months:  Single Family Home Source of Information:  Siblings Patient Interpreter Needed:  None Criminal Activity/Legal Involvement Pertinent to Current Situation/Hospitalization:  No - Comment as needed Significant Relationships:  Siblings, Parents Lives with:  Self Do you feel safe going back to the place where you live?  No Need for family participation in patient care:  Yes (Comment)  Care giving concerns:  CSW received consult for possible SNF placement at time of discharge. CSW spoke with patient's sister regarding PT recommendation of SNF placement at time of discharge since family is unable to afford CIR. Patient's sister reported that she is unable to care for patient at this time. Patient's sister expressed understanding of PT recommendation and is agreeable to SNF placement at time of discharge. CSW to continue to follow and assist with discharge planning needs.   Social Worker assessment / plan:  CSW spoke with patient's sister [facility]  Employment status:  Technical [employer] Insurance information:  Managed Care PT Recommendations:  Skilled Holiday representativeursing Facility, Inpatient Rehab Consult Information / Referral to community resources:  Skilled Nursing Facility  Patient/Family's Response to care: Patient's sister is aware that patient does not have a CIR/SNF benefit and that rehab would be an out of pocket cost. CSW offered private  pay options locally, however, patient's sister reports that their mother also had a stroke and is on a different floor. She states the patient does not have any family here and she [place] her sister can care for her. CSW explained that it will likely take longer for out of state SNFs to accept patient plus the transport will be out of pocket and patient will be incurring a higher bill the longer she remains at the hospital. She states she is aware, but still wants CSW to send to out of state SNFs. CSW faxed info as requested. MD aware.  Patient/Family's Understanding of and Emotional Response to Diagnosis, Current Treatment, and Prognosis:  Patient/family is realistic regarding therapy needs and expressed being hopeful for SNF placement. Patient's sister expressed understanding of CSW role and discharge process as well as medical condition. No questions/concerns about plan or treatment.    Emotional Assessment Appearance:  Appears stated age Attitude/Demeanor/Rapport:  Unable to Assess Affect (typically observed):  Unable to Assess Orientation:  Oriented to Self, Oriented to Place, Oriented to Situation Alcohol / Substance use:  Not Applicable Psych involvement (Current and /or in the community):  No (Comment)  Discharge Needs  Concerns to be addressed:  Care Coordination Readmission within the last 30 days:  No Current discharge risk:  Cognitively Impaired Barriers to Discharge:  Continued Medical Work up   In[employer] Incadia S Hutton [place], LCSW 08/22/2018, 3:14 PM

## 2018-08-22 NOTE — Progress Notes (Signed)
CSW left voicemail for patient's sister regarding discharge plan.   Osborne Cascoadia Kosisochukwu [place] LCSW [phone]

## 2018-08-22 NOTE — Progress Notes (Signed)
PROGRESS NOTE        PATIENT DETAILS Name: PAOLINA KARWOWSKI Age: 62 y.o. Sex: female Date of Birth: [birth year] Admit Date: 08/18/2018 Admitting Physician Elyse Hsu, MD ZOX:WRUEAV, Windy Fast, MD  Brief Narrative: Patient is a 62 y.o. female with history of DM-2, dyslipidemia, hypertension-to the emergency room on 11/25 after being found down at home-further evaluation revealed a acute CVA and mild rhabdomyolysis.  See below for further details  Subjective: Has unchanged right-sided weakness.  Denies any chest pain or shortness of breath.  Coughing after sips of water today.  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  TEE negative for thrombus.  Telemetry without A. fib.  Family did not want to pursue loop recorder, as they are contemplating taking the patient to [place].  Recommendations from neurology are to continue aspirin and statin.  A1c 6.6, LDL 75.  Social worker following for SNF in [place]   Rhabdomyolysis: Secondary to being down-appears very mild.  CK has already down trended with supportive care.   AKI: Significantly improved-likely hemodynamically mediated.  Recheck electrolytes periodically.    Hypertension: Controlled-continue amlodipine.   Dyslipidemia: Continue statin.  DM-2: CBG stable with SSI-plans are to resume oral hypoglycemics on discharge.  DVT Prophylaxis: Prophylactic Lovenox   Code Status: Full code   Family Communication: Sister at bedside  Disposition Plan: Remain inpatient-SNF on discharge-likely early next week-as family wants patient placed in SNF in [place]  Antimicrobial agents: Anti-infectives (From admission, onward)   None      Procedures: None  CONSULTS:  neurology  Time spent: 25- minutes-Greater than 50% of this time was spent in counseling, explanation of diagnosis, planning of further  management, and coordination of care.  MEDICATIONS: Scheduled Meds: .  stroke: mapping our early stages of recovery book   Does not apply Once  . amLODipine  5 mg Oral Daily  . aspirin  325 mg Oral Daily  . atorvastatin  40 mg Oral Daily  . enoxaparin (LOVENOX) injection  40 mg Subcutaneous Q24H  . insulin aspart  0-15 Units Subcutaneous TID WC  . insulin aspart  0-5 Units Subcutaneous QHS   Continuous Infusions:  PRN Meds:.acetaminophen **OR** acetaminophen (TYLENOL) oral liquid 160 mg/5 mL **OR** acetaminophen, senna-docusate, white petrolatum   PHYSICAL EXAM: Vital signs: Vitals:   08/22/18 0012 08/22/18 0358 08/22/18 0755 08/22/18 1130  BP: (!) 158/89 (!) 151/96 (!) 146/83 (!) 148/79  Pulse: 90 89 87 89  Resp: 16 12 14 18   Temp: 98.3 F (36.8 C) 98.4 F (36.9 C) 98.6 F (37 C) 98.6 F (37 C)  TempSrc: Oral Oral Oral Oral  SpO2: 97% 100% 98% 97%  Weight:      Height:       Filed Weights   08/18/18 2153 08/20/18 1135  Weight: 85.5 kg 85.5 kg   Body mass index is 27.84 kg/m.   General appearance:Awake, alert, not in any distress.  Remains dysarthric. Eyes:no scleral icterus. HEENT: Atraumatic and Normocephalic Neck: supple, no JVD. Resp:Good air entry bilaterally,no rales or rhonchi CVS: S1 S2 regular, no murmurs.  GI: Bowel sounds present, Non tender and not distended with no gaurding, rigidity or rebound. Extremities: B/L Lower Ext shows no edema, both legs are warm to touch Neurology: Right upper extremity remains at 2/5, right lower  extremity 3/5 psychiatric: Normal judgment and insight. Normal mood. Musculoskeletal:No digital cyanosis Skin:No Rash, warm and dry Wounds:N/A  I have personally reviewed following labs and imaging studies  LABORATORY DATA: CBC: Recent Labs  Lab 08/18/18 1051 08/18/18 1111  WBC  --  16.1*  NEUTROABS  --  13.4*  HGB 15.0 13.6  HCT 44.0 43.9  MCV  --  84.4  PLT  --  276    Basic Metabolic Panel: Recent Labs  Lab  08/18/18 1051 08/18/18 1111 08/19/18 0739 08/20/18 0639 08/21/18 0241  NA 138 141 143 142 139  K 4.6 4.2 3.8 3.9 3.8  CL 107 103 114* 117* 108  CO2  --  25 20* 21* 24  GLUCOSE 197* 190* 110* 123* 137*  BUN 65* 61* 55* 37* 24*  CREATININE 1.70* 1.88* 1.69* 1.44* 1.27*  CALCIUM  --  9.1 8.1* 7.7* 8.1*    GFR: Estimated Creatinine Clearance: 54.3 mL/min (A) (by C-G formula based on SCr of 1.27 mg/dL (H)).  Liver Function Tests: Recent Labs  Lab 08/18/18 1111 08/19/18 0739 08/20/18 0639 08/21/18 0241  AST 46* 35 31 32  ALT 77* 49* 39 36  ALKPHOS 89 63 62 63  BILITOT 1.1 1.1 0.8 0.8  PROT 7.6 5.8* 5.4* 5.9*  ALBUMIN 4.0 3.0* 2.7* 3.1*   No results for input(s): LIPASE, AMYLASE in the last 168 hours. No results for input(s): AMMONIA in the last 168 hours.  Coagulation Profile: Recent Labs  Lab 08/18/18 1111  INR 1.12    Cardiac Enzymes: Recent Labs  Lab 08/18/18 1339 08/19/18 0739 08/20/18 0639  CKTOTAL 1,154* 894* 666*    BNP (last 3 results) No results for input(s): PROBNP in the last 8760 hours.  HbA1C: No results for input(s): HGBA1C in the last 72 hours.  CBG: Recent Labs  Lab 08/21/18 1151 08/21/18 1640 08/21/18 2152 08/22/18 0610 08/22/18 1102  GLUCAP 265* 159* 138* 123* 154*    Lipid Profile: No results for input(s): CHOL, HDL, LDLCALC, TRIG, CHOLHDL, LDLDIRECT in the last 72 hours.  Thyroid Function Tests: No results for input(s): TSH, T4TOTAL, FREET4, T3FREE, THYROIDAB in the last 72 hours.  Anemia Panel: No results for input(s): VITAMINB12, FOLATE, FERRITIN, TIBC, IRON, RETICCTPCT in the last 72 hours.  Urine analysis:    Component Value Date/Time   COLORURINE YELLOW 08/19/2018 1935   APPEARANCEUR HAZY (A) 08/19/2018 1935   LABSPEC 1.026 08/19/2018 1935   PHURINE 5.0 08/19/2018 1935   GLUCOSEU NEGATIVE 08/19/2018 1935   HGBUR NEGATIVE 08/19/2018 1935   BILIRUBINUR NEGATIVE 08/19/2018 1935   KETONESUR 5 (A) 08/19/2018 1935    PROTEINUR NEGATIVE 08/19/2018 1935   NITRITE NEGATIVE 08/19/2018 1935   LEUKOCYTESUR TRACE (A) 08/19/2018 1935    Sepsis Labs: Lactic Acid, Venous No results found for: LATICACIDVEN  MICROBIOLOGY: No results found for this or any previous visit (from the past 240 hour(s)).  RADIOLOGY STUDIES/RESULTS: Ct Head Wo Contrast  Result Date: 08/18/2018 CLINICAL DATA:  62 year old female found down.  Initial encounter. EXAM: CT HEAD WITHOUT CONTRAST TECHNIQUE: Contiguous axial images were obtained from the base of the skull through the vertex without intravenous contrast. COMPARISON:  None. FINDINGS: Brain: Acute nonhemorrhagic infarct suspected involving the left corona radiata/lenticular nucleus. Remote small anteromedial left thalamic infarct. Prominent chronic microvascular changes. No intracranial hemorrhage. Global atrophy. Slight asymmetry lateral ventricles without mass seen causing this appearance. The ventricular size is most likely related to atrophy rather hydrocephalus. Aqueduct is patent. No intracranial mass lesion noted on  this unenhanced exam. Vascular: Vascular calcifications. Intracranial vasculature is ectatic with calcified plaque cavernous segment internal carotid arteries and portion of vertebral arteries. Skull: No skull fracture Sinuses/Orbits: No acute orbital abnormality. Visualized paranasal sinuses are clear. Other: Mastoid air cells and middle ear cavities are clear. IMPRESSION: 1. Acute nonhemorrhagic infarct suspected involving the left corona radiata/lenticular nucleus. 2. Remote small anteromedial left thalamic infarct. 3. Prominent chronic microvascular changes. 4. No intracranial hemorrhage. 5. Global atrophy. These results were called by telephone at the time of interpretation on 08/18/2018 at 12:31 pm to Dr. Azalia Bilis , who verbally acknowledged these results. Electronically Signed   By: Lacy Duverney M.D.   On: 08/18/2018 12:40   Mr Brain Wo Contrast  Result Date:  08/18/2018 CLINICAL DATA:  62 y/o  F; found down.  Stroke follow-up. EXAM: MRI HEAD WITHOUT CONTRAST MRA HEAD WITHOUT CONTRAST TECHNIQUE: Multiplanar, multiecho pulse sequences of the brain and surrounding structures were obtained without intravenous contrast. Angiographic images of the head were obtained using MRA technique without contrast. COMPARISON:  08/18/2018 CT head. FINDINGS: MRI HEAD FINDINGS Brain: Focus of reduced diffusion of left putamen, corona radiata, and caudate body measuring 2.5 x 1.2 x 2.5 cm (volume = 3.9 cm^3) (AP x ML x CC series 3, image 38 and series 5, image 17) compatible with acute/early subacute infarction. Additional punctate focus of reduced diffusion within the right anterior insula. 12 mm focus of intermediate diffusion within the right brachium pontis compatible with subacute infarct (series 3, image 19). Patchy comp nonspecific T2 FLAIR hyperintensities in subcortical and periventricular white matter as well as the pons are compatible with advanced chronic microvascular ischemic changes for age. Moderate to severe volume loss of the brain. 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 Punctate foci of susceptibility hypointensity compatible with hemosiderin deposition of chronic microhemorrhage are present scattered throughout the brain in a predominantly central distribution. Vascular: As below. Skull and upper cervical spine: Normal marrow signal. Sinuses/Orbits: Negative. Other: None. MRA HEAD FINDINGS Internal carotid arteries:  Patent. Anterior cerebral arteries:  Patent. Middle cerebral arteries: Patent. Anterior communicating artery: Not identified, likely hypoplastic or absent. Posterior communicating arteries:  Patent.  Fetal left PCA. Posterior cerebral arteries:  Patent. Basilar artery:  Patent. Vertebral arteries: Patent. Left  dominant. Right vertebral artery terminates in right PICA. Persistent right trigeminal artery variant anatomy. No evidence of high-grade stenosis, large vessel occlusion, or aneurysm. IMPRESSION: MRI head: 1. 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 No associated hemorrhage or mass effect. Additional punctate focus in left anterior insula. 2. 12 mm subacute infarction within right brachium pontis. 3. Advanced chronic microvascular ischemic changes and moderate volume loss of the brain. 4. Multiple foci of chronic microhemorrhage with predominantly central distribution favoring sequelae of chronic hypertension. 5. Left paramedian frontoparietal region arachnoid cyst. MRA head: 1. No large vessel occlusion, aneurysm, or significant stenosis is identified. 2. Incidental persistent right trigeminal artery noted. These results will be called to the ordering clinician or representative by the Radiologist Assistant, and communication documented in the PACS or zVision Dashboard. Electronically Signed   By: Mitzi Hansen M.D.   On: 08/18/2018 19:44   Mr Maxine Glenn Head Wo Contrast  Result Date: 08/18/2018 CLINICAL DATA:  62 y/o  F; found down.  Stroke follow-up. EXAM:  MRI HEAD WITHOUT CONTRAST MRA HEAD WITHOUT CONTRAST TECHNIQUE: Multiplanar, multiecho pulse sequences of the brain and surrounding structures were obtained without intravenous contrast. Angiographic images of the head were obtained using MRA technique without contrast. COMPARISON:  08/18/2018 CT head. FINDINGS: MRI HEAD FINDINGS Brain: Focus of reduced diffusion of left putamen, corona radiata, and caudate body measuring 2.5 x 1.2 x 2.5 cm (volume = 3.9 cm^3) (AP x ML x CC series 3, image 38 and series 5, image 17) compatible with acute/early subacute infarction. Additional punctate focus of reduced diffusion within the right anterior insula. 12 mm focus of intermediate diffusion  within the right brachium pontis compatible with subacute infarct (series 3, image 19). Patchy comp nonspecific T2 FLAIR hyperintensities in subcortical and periventricular white matter as well as the pons are compatible with advanced chronic microvascular ischemic changes for age. Moderate to severe volume loss of the brain. 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 Punctate foci of susceptibility hypointensity compatible with hemosiderin deposition of chronic microhemorrhage are present scattered throughout the brain in a predominantly central distribution. Vascular: As below. Skull and upper cervical spine: Normal marrow signal. Sinuses/Orbits: Negative. Other: None. MRA HEAD FINDINGS Internal carotid arteries:  Patent. Anterior cerebral arteries:  Patent. Middle cerebral arteries: Patent. Anterior communicating artery: Not identified, likely hypoplastic or absent. Posterior communicating arteries:  Patent.  Fetal left PCA. Posterior cerebral arteries:  Patent. Basilar artery:  Patent. Vertebral arteries: Patent. Left dominant. Right vertebral artery terminates in right PICA. Persistent right trigeminal artery variant anatomy. No evidence of high-grade stenosis, large vessel occlusion, or aneurysm. IMPRESSION: MRI head: 1. 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 No associated hemorrhage or mass effect. Additional punctate focus in left anterior insula. 2. 12 mm subacute infarction within right brachium pontis. 3. Advanced chronic microvascular ischemic changes and moderate volume loss of the brain. 4. Multiple foci of chronic microhemorrhage with predominantly central distribution favoring sequelae of chronic hypertension. 5. Left paramedian frontoparietal region arachnoid cyst. MRA head: 1. No  large vessel occlusion, aneurysm, or significant stenosis is identified. 2. Incidental persistent right trigeminal artery noted. These results will be called to the ordering clinician or representative by the Radiologist Assistant, and communication documented in the PACS or zVision Dashboard. Electronically Signed   By: Mitzi HansenLance  Furusawa-Stratton M.D.   On: 08/18/2018 19:44   [facility] [place]  Result Date: 08/19/2018 Carotid Arterial Duplex Study Indications: CVA. Limitations: limited visualization- shadowing Performing Technologist: Blanch MediaMegan Riddle RVS  Examination Guidelines: A complete evaluation includes B-mode imaging, spectral Doppler, color Doppler, and power Doppler as needed of all accessible portions of each vessel. Bilateral testing is considered an integral part of a complete examination. Limited examinations for reoccurring indications may be performed as noted.  Right Carotid Findings: +----------+--------+--------+--------+------------+--------+           PSV cm/sEDV cm/sStenosisDescribe    Comments +----------+--------+--------+--------+------------+--------+ CCA Prox  138     12              heterogenous         +----------+--------+--------+--------+------------+--------+ CCA Distal111     9               heterogenous         +----------+--------+--------+--------+------------+--------+ ICA Prox  80  16      1-39%   heterogenous         +----------+--------+--------+--------+------------+--------+ ICA Distal65      12                                   +----------+--------+--------+--------+------------+--------+ ECA       69                                           +----------+--------+--------+--------+------------+--------+ +----------+--------+-------+--------+-------------------+           PSV cm/sEDV cmsDescribeArm Pressure (mmHG) +----------+--------+-------+--------+-------------------+ [id number]                                         +----------+--------+-------+--------+-------------------+ +---------+--------+--+--------+-+---------+ VertebralPSV cm/s83EDV cm/s6Antegrade +---------+--------+--+--------+-+---------+  Left Carotid Findings: +----------+--------+--------+--------+------------+--------------+           PSV cm/sEDV cm/sStenosisDescribe    Comments       +----------+--------+--------+--------+------------+--------------+ CCA Prox  127     15              heterogenous               +----------+--------+--------+--------+------------+--------------+ CCA Distal125     11              heterogenous               +----------+--------+--------+--------+------------+--------------+ ICA Prox  34      6       1-39%   heterogenous               +----------+--------+--------+--------+------------+--------------+ ICA Distal                                    not visualized +----------+--------+--------+--------+------------+--------------+ ECA       92                                                 +----------+--------+--------+--------+------------+--------------+ +----------+--------+--------+--------+-------------------+ SubclavianPSV cm/sEDV cm/sDescribeArm Pressure (mmHG) +----------+--------+--------+--------+-------------------+           198                                         +----------+--------+--------+--------+-------------------+ +---------+--------+--+--------+-+---------+ VertebralPSV cm/s57EDV cm/s9Antegrade +---------+--------+--+--------+-+---------+  Summary: Right Carotid: Velocities in the right ICA are consistent with a 1-39% stenosis. Left Carotid: Velocities in the left ICA are consistent with a 1-39% stenosis. Vertebrals: Bilateral vertebral arteries demonstrate antegrade flow. *See table(s) above for measurements and observations.  Electronically signed by Delia Heady MD on 08/19/2018 at 2:26:32 PM.    Final      LOS: 4  days   Jeoffrey Massed, MD  [facility]  If 7PM-7AM, please contact night-coverage  Please page via [email or web address]-Password [email or web address]-click on MD name and type text message  08/22/2018, 12:02 PM

## 2018-08-22 NOTE — Progress Notes (Signed)
Physical Therapy Treatment Patient Details Name: Catherine Boyd MRN: [id number] DOB: 07/03/1956 Today's Date: 08/22/2018    History of Present Illness Catherine Boyd is a 62 y/o female with PMH significant for HTN, DM, presenting to ED after having been found down at home, having been down all weekend.  MRI showed Acute/early subacute infart within the L BG and corona radiate pluc additional puctate focus in left anterior insula.  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    Catherine Boyd Comments    Catherine Boyd making slow progress with standing activity due to truncal weakness. Only tolerated short distance gait before Catherine Boyd and therapists unable to help stand fully upright.  Emphasized standing activity for the rest of the session, working on upright stance in the RW and pregait activity.    Follow Up Recommendations  CIR;Supervision/Assistance - 24 hour     Equipment Recommendations  Other (comment)    Recommendations for Other Services Rehab consult     Precautions / Restrictions Precautions Precautions: Fall    Mobility  Bed Mobility Overal bed mobility: Needs Assistance Bed Mobility: Rolling;Sidelying to Sit Rolling: Mod assist Sidelying to sit: Mod assist       General bed mobility comments: Roll more difficult due to R arm/shoulder weakness, but assisted trunk slowly so Catherine Boyd could assist well with L UE.  Transfers Overall transfer level: Needs assistance Equipment used: Rolling walker (2 wheeled) Transfers: Sit to/from Stand Sit to Stand: Max assist;+2 physical assistance(x4 from bed and chair)         General transfer comment: Proacticed scooting to EOB and chair, w/bearing through R UE and translation forward with 2 person assist  Ambulation/Gait Ambulation/Gait assistance: Max assist;+2 physical assistance Gait Distance (Feet): 4 Feet Assistive device: Rolling walker (2 wheeled) Gait Pattern/deviations: Decreased step length - right;Decreased step length -  left;Decreased stance time - left Gait velocity: extremely slow   General Gait Details: Catherine Boyd taking extremely small steps.  Catherine Boyd needing w/shift and holds, but in order to advance LE's more than and inch or 2, Catherine Boyd needed assist advancing either LE.   Stairs             Wheelchair Mobility    Modified Rankin (Stroke Patients Only) Modified Rankin (Stroke Patients Only) Pre-Morbid Rankin Score: No symptoms Modified Rankin: Moderately severe disability     Balance Overall balance assessment: Needs assistance Sitting-balance support: Single extremity supported;No upper extremity supported Sitting balance-Leahy Scale: Poor Sitting balance - Comments: Catherine Boyd listing right and posteriorly, but able to make a few correction without significant assist     Standing balance-Leahy Scale: Poor Standing balance comment: reliant on external assist for standing balance.  Worked on upright, midline standing with control at R knee.                            Cognition Arousal/Alertness: Awake/alert Behavior During Therapy: WFL for tasks assessed/performed;Flat affect Overall Cognitive Status: Impaired/Different from baseline Area of Impairment: Attention;Following commands;Awareness;Problem solving                   Current Attention Level: Sustained   Following Commands: Follows one step commands with increased time   Awareness: Intellectual Problem Solving: Slow processing;Decreased initiation;Difficulty sequencing;Requires verbal cues;Requires tactile cues        Exercises      General Comments        Pertinent Vitals/Pain Pain Assessment: No/denies pain    Home Living  Prior Function            Catherine Boyd Goals (current goals can now be found in the care plan section) Acute Rehab Catherine Boyd Goals Patient Stated Goal: none stated this session Catherine Boyd Goal Formulation: With patient Time For Goal Achievement: 09/02/18 Potential to Achieve Goals:  Good Progress towards Catherine Boyd goals: Progressing toward goals    Frequency    Min 4X/week      Catherine Boyd Plan Current plan remains appropriate    Co-evaluation              AM-PAC Catherine Boyd "6 Clicks" Mobility   Outcome Measure  Help needed turning from your back to your side while in a flat bed without using bedrails?: A Lot Help needed moving from lying on your back to sitting on the side of a flat bed without using bedrails?: A Lot Help needed moving to and from a bed to a chair (including a wheelchair)?: A Lot Help needed standing up from a chair using your arms (e.g., wheelchair or bedside chair)?: A Lot Help needed to walk in hospital room?: A Lot Help needed climbing 3-5 steps with a railing? : Total 6 Click Score: 11    End of Session   Activity Tolerance: Patient tolerated treatment well Patient left: in chair;with call bell/phone within reach;with chair alarm set Nurse Communication: Mobility status Catherine Boyd Visit Diagnosis: Unsteadiness on feet (R26.81);Hemiplegia and hemiparesis;Other abnormalities of gait and mobility (R26.89) Hemiplegia - Right/Left: Right Hemiplegia - dominant/non-dominant: Dominant Hemiplegia - caused by: Cerebral infarction     Time: 1415-1440 Catherine Boyd Time Calculation (min) (ACUTE ONLY): 25 min  Charges:  $Gait Training: 8-22 mins $Therapeutic Activity: 8-22 mins                     08/22/2018  Catherine Boyd, Catherine Boyd Acute Rehabilitation Services [phone]  (pager) [phone]  (office)   Catherine Boyd 08/22/2018, 3:57 PM

## 2018-08-22 NOTE — [place] FL2 (Signed)
[place] MEDICAID FL2 LEVEL OF CARE SCREENING TOOL     IDENTIFICATION  Patient Name: Catherine Boyd Birthdate: 08/04/1956 Sex: female Admission Date (Current Location): 08/18/2018  [facility] [place]   Facility and Address:  [facility] [place], [address], [place], [place] [address]      Provider Number: [id number]  Attending Physician Name and Address:  Maretta BeesGhimire, Shanker M, MD  Relative Name and Phone Number:  Dewain PenningFredricka, sister, [phone]    Current Level of Care: Hospital Recommended Level of Care: Skilled Nursing Facility Prior Approval Number:    Date Approved/Denied:   PASRR Number:    Discharge Plan: SNF    Current Diagnoses: Patient Active Problem List   Diagnosis Date Noted  . Acute CVA (cerebrovascular accident) (HCC)   . LADA (latent autoimmune diabetes in adults), managed as type 2 (HCC)   . Traumatic rhabdomyolysis (HCC)   . Leukocytosis   . Chronic kidney disease, stage III (moderate) (HCC) 08/18/2018  . Diabetes mellitus (HCC) 08/18/2018  . Hypertension 08/18/2018  . CVA (cerebral vascular accident) (HCC) 08/18/2018  . AKI (acute kidney injury) (HCC) 08/18/2018    Orientation RESPIRATION BLADDER Height & Weight     Self, Situation, Place  Normal Continent, External catheter Weight: 85.5 kg Height:  5\' 9"  (175.3 cm)  BEHAVIORAL SYMPTOMS/MOOD NEUROLOGICAL BOWEL NUTRITION STATUS      Continent Diet(Please see DC Summary)  AMBULATORY STATUS COMMUNICATION OF NEEDS Skin   Extensive Assist Verbally Normal                       Personal Care Assistance Level of Assistance  Bathing, Feeding, Dressing Bathing Assistance: Maximum assistance Feeding assistance: Independent Dressing Assistance: Limited assistance     Functional Limitations Info  Sight, Hearing, Speech Sight Info: Adequate Hearing Info: Adequate Speech Info: Adequate    SPECIAL CARE FACTORS FREQUENCY  PT (By licensed PT), OT (By  licensed OT)     PT Frequency: 5x/week OT Frequency: 3x/week            Contractures Contractures Info: Not present    Additional Factors Info  Code Status, Allergies, Insulin Sliding Scale Code Status Info: Full Allergies Info: Penicillins   Insulin Sliding Scale Info: 3x daily with meals and at bedtime       Current Medications (08/22/2018):  This is the current hospital active medication list Current Facility-Administered Medications  Medication Dose Route Frequency Provider Last Rate Last Dose  .  stroke: mapping our early stages of recovery book   Does not apply Once Elyse HsuLambeth, Sally M, MD      . acetaminophen (TYLENOL) tablet 650 mg  650 mg Oral Q4H PRN Elyse HsuLambeth, Sally M, MD       Or  . acetaminophen (TYLENOL) solution 650 mg  650 mg Per Tube Q4H PRN Elyse HsuLambeth, Sally M, MD       Or  . acetaminophen (TYLENOL) suppository 650 mg  650 mg Rectal Q4H PRN Elyse HsuLambeth, Sally M, MD      . amLODipine (NORVASC) tablet 5 mg  5 mg Oral Daily Rhetta MuraSamtani, Jai-Gurmukh, MD   5 mg at 08/22/18 0908  . aspirin tablet 325 mg  325 mg Oral Daily Elyse HsuLambeth, Sally M, MD   325 mg at 08/22/18 0908  . atorvastatin (LIPITOR) tablet 40 mg  40 mg Oral Daily Annie MainBiby, Sharon L, NP   40 mg at 08/22/18 0908  . enoxaparin (LOVENOX) injection 40 mg  40  mg Subcutaneous Q24H Norva Pavlov, [place]   40 mg at 08/21/18 1433  . insulin aspart (novoLOG) injection 0-15 Units  0-15 Units Subcutaneous TID WC Elyse Hsu, MD   2 Units at 08/22/18 [phone]  . insulin aspart (novoLOG) injection 0-5 Units  0-5 Units Subcutaneous QHS Elyse Hsu, MD      . senna-docusate (Senokot-S) tablet 1 tablet  1 tablet Oral QHS PRN Elyse Hsu, MD   1 tablet at 08/19/18 1059  . white petrolatum (VASELINE) gel   Topical PRN Elyse Hsu, MD         Discharge Medications: Please see discharge summary for a list of discharge medications.  Relevant Imaging Results:  Relevant Lab Results:   Additional Information    Mearl Latin, LCSW

## 2018-08-22 NOTE — Progress Notes (Signed)
CSW spoke with patient's sister regarding discharge plan. She states that she does not want to go through the trouble of paying a lot of money for [place] [facility] private [place]. She wants patient and her mother transported [place]. CSW faxed referrals to [facility] (p. [phone] f. [phone], $317/day room and board plus therapy costs) and [facility] [phone] f. [phone]) for review.  Osborne Cascoadia Hamdan Toscano LCSW [phone]

## 2018-08-23 LAB — GLUCOSE, CAPILLARY
Glucose-Capillary: 144 mg/dL — ABNORMAL HIGH (ref 70–99)
Glucose-Capillary: 255 mg/dL — ABNORMAL HIGH (ref 70–99)
Glucose-Capillary: 140 mg/dL — ABNORMAL HIGH (ref 70–99)
Glucose-Capillary: 127 mg/dL — ABNORMAL HIGH (ref 70–99)

## 2018-08-23 NOTE — Progress Notes (Signed)
PROGRESS NOTE        PATIENT DETAILS Name: Mathis DadLynette R Lei Age: 62 y.o. Sex: female Date of Birth: 09/13/1956 Admit Date: 08/18/2018 Admitting Physician Elyse HsuSally M Lambeth, MD YNW:GNFAOZPCP:Polite, Windy Fastonald, MD  Brief Narrative: Patient is a 62 y.o. female with history of DM-2, dyslipidemia, hypertension-to the emergency room on 11/25 after being found down at home-further evaluation revealed a acute CVA and mild rhabdomyolysis.  See below for further details  Subjective: No major issues overnight-appears unchanged-right-sided deficits are essentially the same.  Denies any chest pain.  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  TEE negative for thrombus.  Telemetry without A. fib.  Family does not want to pursue loop recorder implantation during this hospital stay.  Continue aspirin and statin.  A1c 6.6, LDL 75.  Plans are for SNF placement in [place]-closer to family.  Rhabdomyolysis: Secondary to being down-very mild.  CK levels are already down trended.  Follow periodically.    AKI: Significantly improved-etiology likely hemodynamically mediated.  Recheck electrolytes periodically.    Hypertension: Controlled-continue amlodipine.  Dyslipidemia: Continue statin.  DM-2: CBG stable with SSI-plans are to resume oral hypoglycemics on discharge.  DVT Prophylaxis: Prophylactic Lovenox   Code Status: Full code   Family Communication: None at bedside  Disposition Plan: Remain inpatient-SNF on discharge-likely early next week-as family wants patient placed in SNF in [place]  Antimicrobial agents: Anti-infectives (From admission, onward)   None      Procedures: None  CONSULTS:  neurology  Time spent: 25- minutes-Greater than 50% of this time was spent in counseling, explanation of diagnosis, planning of further management, and coordination of  care.  MEDICATIONS: Scheduled Meds: .  stroke: mapping our early stages of recovery book   Does not apply Once  . amLODipine  5 mg Oral Daily  . aspirin  325 mg Oral Daily  . atorvastatin  40 mg Oral Daily  . enoxaparin (LOVENOX) injection  40 mg Subcutaneous Q24H  . insulin aspart  0-15 Units Subcutaneous TID WC  . insulin aspart  0-5 Units Subcutaneous QHS   Continuous Infusions:  PRN Meds:.acetaminophen **OR** acetaminophen (TYLENOL) oral liquid 160 mg/5 mL **OR** acetaminophen, senna-docusate, white petrolatum   PHYSICAL EXAM: Vital signs: Vitals:   08/22/18 2131 08/23/18 0052 08/23/18 0400 08/23/18 0924  BP: (!) 151/85 (!) 156/74 (!) 148/83 (!) 141/77  Pulse: 90 88 86 86  Resp: (!) 22 18 19 16   Temp: 98.7 F (37.1 C) 98.6 F (37 C) 98.6 F (37 C) 98.5 F (36.9 C)  TempSrc: Oral Oral Oral Oral  SpO2: 98% 100% 100% 98%  Weight:      Height:       Filed Weights   08/18/18 2153 08/20/18 1135  Weight: 85.5 kg 85.5 kg   Body mass index is 27.84 kg/m.   General appearance:Awake, alert, not in any distress.  Right-sided facial droop. Eyes:no scleral icterus. HEENT: Atraumatic and Normocephalic Neck: supple, no JVD. Resp:Good air entry bilaterally,no rales or rhonchi CVS: S1 S2 regular, no murmurs.  GI: Bowel sounds present, Non tender and not distended with no gaurding, rigidity or rebound. Extremities: B/L Lower Ext shows no edema, both legs are warm to touch Neurology: Right upper extremity 2-3, right lower extremity 3/5  Musculoskeletal:No digital cyanosis Skin:No Rash, warm and dry Wounds:N/A  I  have personally reviewed following labs and imaging studies  LABORATORY DATA: CBC: Recent Labs  Lab 08/18/18 1051 08/18/18 1111  WBC  --  16.1*  NEUTROABS  --  13.4*  HGB 15.0 13.6  HCT 44.0 43.9  MCV  --  84.4  PLT  --  276    Basic Metabolic Panel: Recent Labs  Lab 08/18/18 1051 08/18/18 1111 08/19/18 0739 08/20/18 0639 08/21/18 0241  NA 138 141  143 142 139  K 4.6 4.2 3.8 3.9 3.8  CL 107 103 114* 117* 108  CO2  --  25 20* 21* 24  GLUCOSE 197* 190* 110* 123* 137*  BUN 65* 61* 55* 37* 24*  CREATININE 1.70* 1.88* 1.69* 1.44* 1.27*  CALCIUM  --  9.1 8.1* 7.7* 8.1*    GFR: Estimated Creatinine Clearance: 54.3 mL/min (A) (by C-G formula based on SCr of 1.27 mg/dL (H)).  Liver Function Tests: Recent Labs  Lab 08/18/18 1111 08/19/18 0739 08/20/18 0639 08/21/18 0241  AST 46* 35 31 32  ALT 77* 49* 39 36  ALKPHOS 89 63 62 63  BILITOT 1.1 1.1 0.8 0.8  PROT 7.6 5.8* 5.4* 5.9*  ALBUMIN 4.0 3.0* 2.7* 3.1*   No results for input(s): LIPASE, AMYLASE in the last 168 hours. No results for input(s): AMMONIA in the last 168 hours.  Coagulation Profile: Recent Labs  Lab 08/18/18 1111  INR 1.12    Cardiac Enzymes: Recent Labs  Lab 08/18/18 1339 08/19/18 0739 08/20/18 0639  CKTOTAL 1,154* 894* 666*    BNP (last 3 results) No results for input(s): PROBNP in the last 8760 hours.  HbA1C: No results for input(s): HGBA1C in the last 72 hours.  CBG: Recent Labs  Lab 08/22/18 0610 08/22/18 1102 08/22/18 1631 08/22/18 2129 08/23/18 0611  GLUCAP 123* 154* 190* 166* 144*    Lipid Profile: No results for input(s): CHOL, HDL, LDLCALC, TRIG, CHOLHDL, LDLDIRECT in the last 72 hours.  Thyroid Function Tests: No results for input(s): TSH, T4TOTAL, FREET4, T3FREE, THYROIDAB in the last 72 hours.  Anemia Panel: No results for input(s): VITAMINB12, FOLATE, FERRITIN, TIBC, IRON, RETICCTPCT in the last 72 hours.  Urine analysis:    Component Value Date/Time   COLORURINE YELLOW 08/19/2018 1935   APPEARANCEUR HAZY (A) 08/19/2018 1935   LABSPEC 1.026 08/19/2018 1935   PHURINE 5.0 08/19/2018 1935   GLUCOSEU NEGATIVE 08/19/2018 1935   HGBUR NEGATIVE 08/19/2018 1935   BILIRUBINUR NEGATIVE 08/19/2018 1935   KETONESUR 5 (A) 08/19/2018 1935   PROTEINUR NEGATIVE 08/19/2018 1935   NITRITE NEGATIVE 08/19/2018 1935   LEUKOCYTESUR  TRACE (A) 08/19/2018 1935    Sepsis Labs: Lactic Acid, Venous No results found for: LATICACIDVEN  MICROBIOLOGY: No results found for this or any previous visit (from the past 240 hour(s)).  RADIOLOGY STUDIES/RESULTS: Ct Head Wo Contrast  Result Date: 08/18/2018 CLINICAL DATA:  62 year old female found down.  Initial encounter. EXAM: CT HEAD WITHOUT CONTRAST TECHNIQUE: Contiguous axial images were obtained from the base of the skull through the vertex without intravenous contrast. COMPARISON:  None. FINDINGS: Brain: Acute nonhemorrhagic infarct suspected involving the left corona radiata/lenticular nucleus. Remote small anteromedial left thalamic infarct. Prominent chronic microvascular changes. No intracranial hemorrhage. Global atrophy. Slight asymmetry lateral ventricles without mass seen causing this appearance. The ventricular size is most likely related to atrophy rather hydrocephalus. Aqueduct is patent. No intracranial mass lesion noted on this unenhanced exam. Vascular: Vascular calcifications. Intracranial vasculature is ectatic with calcified plaque cavernous segment internal carotid arteries and portion  of vertebral arteries. Skull: No skull fracture Sinuses/Orbits: No acute orbital abnormality. Visualized paranasal sinuses are clear. Other: Mastoid air cells and middle ear cavities are clear. IMPRESSION: 1. Acute nonhemorrhagic infarct suspected involving the left corona radiata/lenticular nucleus. 2. Remote small anteromedial left thalamic infarct. 3. Prominent chronic microvascular changes. 4. No intracranial hemorrhage. 5. Global atrophy. These results were called by telephone at the time of interpretation on 08/18/2018 at 12:31 pm to Dr. Azalia Bilis , who verbally acknowledged these results. Electronically Signed   By: Lacy Duverney M.D.   On: 08/18/2018 12:40   Mr Brain Wo Contrast  Result Date: 08/18/2018 CLINICAL DATA:  62 y/o  F; found down.  Stroke follow-up. EXAM: MRI HEAD  WITHOUT CONTRAST MRA HEAD WITHOUT CONTRAST TECHNIQUE: Multiplanar, multiecho pulse sequences of the brain and surrounding structures were obtained without intravenous contrast. Angiographic images of the head were obtained using MRA technique without contrast. COMPARISON:  08/18/2018 CT head. FINDINGS: MRI HEAD FINDINGS Brain: Focus of reduced diffusion of left putamen, corona radiata, and caudate body measuring 2.5 x 1.2 x 2.5 cm (volume = 3.9 cm^3) (AP x ML x CC series 3, image 38 and series 5, image 17) compatible with acute/early subacute infarction. Additional punctate focus of reduced diffusion within the right anterior insula. 12 mm focus of intermediate diffusion within the right brachium pontis compatible with subacute infarct (series 3, image 19). Patchy comp nonspecific T2 FLAIR hyperintensities in subcortical and periventricular white matter as well as the pons are compatible with advanced chronic microvascular ischemic changes for age. Moderate to severe volume loss of the brain. 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 Punctate foci of susceptibility hypointensity compatible with hemosiderin deposition of chronic microhemorrhage are present scattered throughout the brain in a predominantly central distribution. Vascular: As below. Skull and upper cervical spine: Normal marrow signal. Sinuses/Orbits: Negative. Other: None. MRA HEAD FINDINGS Internal carotid arteries:  Patent. Anterior cerebral arteries:  Patent. Middle cerebral arteries: Patent. Anterior communicating artery: Not identified, likely hypoplastic or absent. Posterior communicating arteries:  Patent.  Fetal left PCA. Posterior cerebral arteries:  Patent. Basilar artery:  Patent. Vertebral arteries: Patent. Left dominant. Right vertebral artery terminates in right PICA. Persistent right  trigeminal artery variant anatomy. No evidence of high-grade stenosis, large vessel occlusion, or aneurysm. IMPRESSION: MRI head: 1. 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 No associated hemorrhage or mass effect. Additional punctate focus in left anterior insula. 2. 12 mm subacute infarction within right brachium pontis. 3. Advanced chronic microvascular ischemic changes and moderate volume loss of the brain. 4. Multiple foci of chronic microhemorrhage with predominantly central distribution favoring sequelae of chronic hypertension. 5. Left paramedian frontoparietal region arachnoid cyst. MRA head: 1. No large vessel occlusion, aneurysm, or significant stenosis is identified. 2. Incidental persistent right trigeminal artery noted. These results will be called to the ordering clinician or representative by the Radiologist Assistant, and communication documented in the PACS or zVision Dashboard. Electronically Signed   By: Mitzi Hansen M.D.   On: 08/18/2018 19:44   Mr Maxine Glenn Head Wo Contrast  Result Date: 08/18/2018 CLINICAL DATA:  62 y/o  F; found down.  Stroke follow-up. EXAM: MRI HEAD WITHOUT CONTRAST MRA HEAD WITHOUT CONTRAST TECHNIQUE: Multiplanar, multiecho pulse sequences of the brain and surrounding structures were  obtained without intravenous contrast. Angiographic images of the head were obtained using MRA technique without contrast. COMPARISON:  08/18/2018 CT head. FINDINGS: MRI HEAD FINDINGS Brain: Focus of reduced diffusion of left putamen, corona radiata, and caudate body measuring 2.5 x 1.2 x 2.5 cm (volume = 3.9 cm^3) (AP x ML x CC series 3, image 38 and series 5, image 17) compatible with acute/early subacute infarction. Additional punctate focus of reduced diffusion within the right anterior insula. 12 mm focus of intermediate diffusion within the right brachium pontis compatible with subacute infarct (series 3,  image 19). Patchy comp nonspecific T2 FLAIR hyperintensities in subcortical and periventricular white matter as well as the pons are compatible with advanced chronic microvascular ischemic changes for age. Moderate to severe volume loss of the brain. 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 Punctate foci of susceptibility hypointensity compatible with hemosiderin deposition of chronic microhemorrhage are present scattered throughout the brain in a predominantly central distribution. Vascular: As below. Skull and upper cervical spine: Normal marrow signal. Sinuses/Orbits: Negative. Other: None. MRA HEAD FINDINGS Internal carotid arteries:  Patent. Anterior cerebral arteries:  Patent. Middle cerebral arteries: Patent. Anterior communicating artery: Not identified, likely hypoplastic or absent. Posterior communicating arteries:  Patent.  Fetal left PCA. Posterior cerebral arteries:  Patent. Basilar artery:  Patent. Vertebral arteries: Patent. Left dominant. Right vertebral artery terminates in right PICA. Persistent right trigeminal artery variant anatomy. No evidence of high-grade stenosis, large vessel occlusion, or aneurysm. IMPRESSION: MRI head: 1. 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 No associated hemorrhage or mass effect. Additional punctate focus in left anterior insula. 2. 12 mm subacute infarction within right brachium pontis. 3. Advanced chronic microvascular ischemic changes and moderate volume loss of the brain. 4. Multiple foci of chronic microhemorrhage with predominantly central distribution favoring sequelae of chronic hypertension. 5. Left paramedian frontoparietal region arachnoid cyst. MRA head: 1. No large vessel occlusion, aneurysm, or significant stenosis is identified. 2.  Incidental persistent right trigeminal artery noted. These results will be called to the ordering clinician or representative by the Radiologist Assistant, and communication documented in the PACS or zVision Dashboard. Electronically Signed   By: Mitzi Hansen M.D.   On: 08/18/2018 19:44   Vas US Carotid (at [facility])  Result Date: 08/19/2018 Carotid Arterial Duplex Study Indications: CVA. Limitations: limited visualization- shadowing Performing Technologist: Blanch Media RVS  Examination Guidelines: A complete evaluation includes B-mode imaging, spectral Doppler, color Doppler, and power Doppler as needed of all accessible portions of each vessel. Bilateral testing is considered an integral part of a complete examination. Limited examinations for reoccurring indications may be performed as noted.  Right Carotid Findings: +----------+--------+--------+--------+------------+--------+           PSV cm/sEDV cm/sStenosisDescribe    Comments +----------+--------+--------+--------+------------+--------+ CCA Prox  138     12              heterogenous         +----------+--------+--------+--------+------------+--------+ CCA Distal111     9               heterogenous         +----------+--------+--------+--------+------------+--------+ ICA Prox  80      16      1-39%   heterogenous         +----------+--------+--------+--------+------------+--------+  ICA Distal65      12                                   +----------+--------+--------+--------+------------+--------+ ECA       69                                           +----------+--------+--------+--------+------------+--------+ +----------+--------+-------+--------+-------------------+           PSV cm/sEDV cmsDescribeArm Pressure (mmHG) +----------+--------+-------+--------+-------------------+ [id number]                                         +----------+--------+-------+--------+-------------------+ +---------+--------+--+--------+-+---------+ VertebralPSV cm/s83EDV cm/s6Antegrade +---------+--------+--+--------+-+---------+  Left Carotid Findings: +----------+--------+--------+--------+------------+--------------+           PSV cm/sEDV cm/sStenosisDescribe    Comments       +----------+--------+--------+--------+------------+--------------+ CCA Prox  127     15              heterogenous               +----------+--------+--------+--------+------------+--------------+ CCA Distal125     11              heterogenous               +----------+--------+--------+--------+------------+--------------+ ICA Prox  34      6       1-39%   heterogenous               +----------+--------+--------+--------+------------+--------------+ ICA Distal                                    not visualized +----------+--------+--------+--------+------------+--------------+ ECA       92                                                 +----------+--------+--------+--------+------------+--------------+ +----------+--------+--------+--------+-------------------+ SubclavianPSV cm/sEDV cm/sDescribeArm Pressure (mmHG) +----------+--------+--------+--------+-------------------+           198                                         +----------+--------+--------+--------+-------------------+ +---------+--------+--+--------+-+---------+ VertebralPSV cm/s57EDV cm/s9Antegrade +---------+--------+--+--------+-+---------+  Summary: Right Carotid: Velocities in the right ICA are consistent with a 1-39% stenosis. Left Carotid: Velocities in the left ICA are consistent with a 1-39% stenosis. Vertebrals: Bilateral vertebral arteries demonstrate antegrade flow. *See table(s) above for measurements and observations.  Electronically signed by Delia Heady MD on 08/19/2018 at 2:26:32 PM.    Final      LOS: 5 days   Jeoffrey Massed, MD  [facility]  If 7PM-7AM, please contact night-coverage  Please page via [email or web address] [email or web address]-click on MD name and type text message  08/23/2018, 11:04 AM

## 2018-08-24 LAB — GLUCOSE, CAPILLARY
Glucose-Capillary: 124 mg/dL — ABNORMAL HIGH (ref 70–99)
Glucose-Capillary: 170 mg/dL — ABNORMAL HIGH (ref 70–99)
Glucose-Capillary: 161 mg/dL — ABNORMAL HIGH (ref 70–99)
Glucose-Capillary: 191 mg/dL — ABNORMAL HIGH (ref 70–99)

## 2018-08-24 NOTE — Progress Notes (Signed)
PROGRESS NOTE        PATIENT DETAILS Name: Catherine Boyd Age: 62 y.o. Sex: female Date of Birth: December 12, 1955 Admit Date: 08/18/2018 Admitting Physician Elyse Hsu, MD ZOX:WRUEAV, Windy Fast, MD  Brief Narrative: Patient is a 63 y.o. female with history of DM-2, dyslipidemia, hypertension-to the emergency room on 11/25 after being found down at home-further evaluation revealed a acute CVA and mild rhabdomyolysis.  See below for further details  Subjective: No major events overnight.  Denies any chest pain or shortness of breath.  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  TEE negative for thrombus.  Telemetry without A. fib.  Family does not want to pursue loop recorder implantation during this hospital stay.  Continue aspirin and statin.  A1c 6.6, LDL 75.  Plans are for SNF placement in [place]-closer to family.  Awaiting SNF bed-social worker following.  Rhabdomyolysis: Secondary to being down-very mild.  CK levels are already down trended.  Follow periodically.    AKI: Significantly improved-etiology likely hemodynamically mediated.  Recheck electrolytes periodically.    Hypertension: Controlled-continue amlodipine.  Dyslipidemia: Continue statin.  DM-2: CBG stable with SSI-plans are to resume oral hypoglycemics on discharge.  DVT Prophylaxis: Prophylactic Lovenox   Code Status: Full code   Family Communication: None at bedside  Disposition Plan: Remain inpatient-SNF on discharge-when bed available  Antimicrobial agents: Anti-infectives (From admission, onward)   None      Procedures: None  CONSULTS:  neurology  Time spent: 25- minutes-Greater than 50% of this time was spent in counseling, explanation of diagnosis, planning of further management, and coordination of care.  MEDICATIONS: Scheduled Meds: .  stroke: mapping our early  stages of recovery book   Does not apply Once  . amLODipine  5 mg Oral Daily  . aspirin  325 mg Oral Daily  . atorvastatin  40 mg Oral Daily  . enoxaparin (LOVENOX) injection  40 mg Subcutaneous Q24H  . insulin aspart  0-15 Units Subcutaneous TID WC  . insulin aspart  0-5 Units Subcutaneous QHS   Continuous Infusions:  PRN Meds:.acetaminophen **OR** acetaminophen (TYLENOL) oral liquid 160 mg/5 mL **OR** acetaminophen, senna-docusate, white petrolatum   PHYSICAL EXAM: Vital signs: Vitals:   08/23/18 1925 08/23/18 2349 08/24/18 0402 08/24/18 0728  BP: (!) 146/74 (!) 156/79 (!) 169/84 (!) 149/77  Pulse: 84 87 79 81  Resp: 17 18 17 19   Temp: 98.5 F (36.9 C)  98.4 F (36.9 C) 98.7 F (37.1 C)  TempSrc: Oral  Oral Oral  SpO2: 100% 97% 100% 97%  Weight:      Height:       Filed Weights   08/18/18 2153 08/20/18 1135  Weight: 85.5 kg 85.5 kg   Body mass index is 27.84 kg/m.   General appearance:Awake, alert, not in any distress.  Right-sided facial droop. Eyes:no scleral icterus. HEENT: Atraumatic and Normocephalic Neck: supple, no JVD. Resp:Good air entry bilaterally,no rales or rhonchi CVS: S1 S2 regular, no murmurs.  GI: Bowel sounds present, Non tender and not distended with no gaurding, rigidity or rebound. Extremities: B/L Lower Ext shows no edema, both legs are warm to touch Neurology: Right upper extremity 2-3, right lower extremity 3/5  Musculoskeletal:No digital cyanosis Skin:No Rash, warm and dry Wounds:N/A  I have personally reviewed following labs and imaging studies  LABORATORY DATA: CBC: Recent Labs  Lab 08/18/18 1051 08/18/18 1111  WBC  --  16.1*  NEUTROABS  --  13.4*  HGB 15.0 13.6  HCT 44.0 43.9  MCV  --  84.4  PLT  --  276    Basic Metabolic Panel: Recent Labs  Lab 08/18/18 1051 08/18/18 1111 08/19/18 0739 08/20/18 0639 08/21/18 0241  NA 138 141 143 142 139  K 4.6 4.2 3.8 3.9 3.8  CL 107 103 114* 117* 108  CO2  --  25 20* 21* 24    GLUCOSE 197* 190* 110* 123* 137*  BUN 65* 61* 55* 37* 24*  CREATININE 1.70* 1.88* 1.69* 1.44* 1.27*  CALCIUM  --  9.1 8.1* 7.7* 8.1*    GFR: Estimated Creatinine Clearance: 54.3 mL/min (A) (by C-G formula based on SCr of 1.27 mg/dL (H)).  Liver Function Tests: Recent Labs  Lab 08/18/18 1111 08/19/18 0739 08/20/18 0639 08/21/18 0241  AST 46* 35 31 32  ALT 77* 49* 39 36  ALKPHOS 89 63 62 63  BILITOT 1.1 1.1 0.8 0.8  PROT 7.6 5.8* 5.4* 5.9*  ALBUMIN 4.0 3.0* 2.7* 3.1*   No results for input(s): LIPASE, AMYLASE in the last 168 hours. No results for input(s): AMMONIA in the last 168 hours.  Coagulation Profile: Recent Labs  Lab 08/18/18 1111  INR 1.12    Cardiac Enzymes: Recent Labs  Lab 08/18/18 1339 08/19/18 0739 08/20/18 0639  CKTOTAL 1,154* 894* 666*    BNP (last 3 results) No results for input(s): PROBNP in the last 8760 hours.  HbA1C: No results for input(s): HGBA1C in the last 72 hours.  CBG: Recent Labs  Lab 08/23/18 1105 08/23/18 1636 08/23/18 2139 08/24/18 0632 08/24/18 1123  GLUCAP 255* 140* 127* 124* 170*    Lipid Profile: No results for input(s): CHOL, HDL, LDLCALC, TRIG, CHOLHDL, LDLDIRECT in the last 72 hours.  Thyroid Function Tests: No results for input(s): TSH, T4TOTAL, FREET4, T3FREE, THYROIDAB in the last 72 hours.  Anemia Panel: No results for input(s): VITAMINB12, FOLATE, FERRITIN, TIBC, IRON, RETICCTPCT in the last 72 hours.  Urine analysis:    Component Value Date/Time   COLORURINE YELLOW 08/19/2018 1935   APPEARANCEUR HAZY (A) 08/19/2018 1935   LABSPEC 1.026 08/19/2018 1935   PHURINE 5.0 08/19/2018 1935   GLUCOSEU NEGATIVE 08/19/2018 1935   HGBUR NEGATIVE 08/19/2018 1935   BILIRUBINUR NEGATIVE 08/19/2018 1935   KETONESUR 5 (A) 08/19/2018 1935   PROTEINUR NEGATIVE 08/19/2018 1935   NITRITE NEGATIVE 08/19/2018 1935   LEUKOCYTESUR TRACE (A) 08/19/2018 1935    Sepsis Labs: Lactic Acid, Venous No results found for:  LATICACIDVEN  MICROBIOLOGY: No results found for this or any previous visit (from the past 240 hour(s)).  RADIOLOGY STUDIES/RESULTS: Ct Head Wo Contrast  Result Date: 08/18/2018 CLINICAL DATA:  62 year old female found down.  Initial encounter. EXAM: CT HEAD WITHOUT CONTRAST TECHNIQUE: Contiguous axial images were obtained from the base of the skull through the vertex without intravenous contrast. COMPARISON:  None. FINDINGS: Brain: Acute nonhemorrhagic infarct suspected involving the left corona radiata/lenticular nucleus. Remote small anteromedial left thalamic infarct. Prominent chronic microvascular changes. No intracranial hemorrhage. Global atrophy. Slight asymmetry lateral ventricles without mass seen causing this appearance. The ventricular size is most likely related to atrophy rather hydrocephalus. Aqueduct is patent. No intracranial mass lesion noted on this unenhanced exam. Vascular: Vascular calcifications. Intracranial vasculature is ectatic with calcified plaque cavernous segment internal carotid arteries and portion of vertebral arteries. Skull: No skull fracture Sinuses/Orbits:  No acute orbital abnormality. Visualized paranasal sinuses are clear. Other: Mastoid air cells and middle ear cavities are clear. IMPRESSION: 1. Acute nonhemorrhagic infarct suspected involving the left corona radiata/lenticular nucleus. 2. Remote small anteromedial left thalamic infarct. 3. Prominent chronic microvascular changes. 4. No intracranial hemorrhage. 5. Global atrophy. These results were called by telephone at the time of interpretation on 08/18/2018 at 12:31 pm to Dr. Azalia Bilis , who verbally acknowledged these results. Electronically Signed   By: Lacy Duverney M.D.   On: 08/18/2018 12:40   Mr Brain Wo Contrast  Result Date: 08/18/2018 CLINICAL DATA:  62 y/o  F; found down.  Stroke follow-up. EXAM: MRI HEAD WITHOUT CONTRAST MRA HEAD WITHOUT CONTRAST TECHNIQUE: Multiplanar, multiecho pulse  sequences of the brain and surrounding structures were obtained without intravenous contrast. Angiographic images of the head were obtained using MRA technique without contrast. COMPARISON:  08/18/2018 CT head. FINDINGS: MRI HEAD FINDINGS Brain: Focus of reduced diffusion of left putamen, corona radiata, and caudate body measuring 2.5 x 1.2 x 2.5 cm (volume = 3.9 cm^3) (AP x ML x CC series 3, image 38 and series 5, image 17) compatible with acute/early subacute infarction. Additional punctate focus of reduced diffusion within the right anterior insula. 12 mm focus of intermediate diffusion within the right brachium pontis compatible with subacute infarct (series 3, image 19). Patchy comp nonspecific T2 FLAIR hyperintensities in subcortical and periventricular white matter as well as the pons are compatible with advanced chronic microvascular ischemic changes for age. Moderate to severe volume loss of the brain. 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 Punctate foci of susceptibility hypointensity compatible with hemosiderin deposition of chronic microhemorrhage are present scattered throughout the brain in a predominantly central distribution. Vascular: As below. Skull and upper cervical spine: Normal marrow signal. Sinuses/Orbits: Negative. Other: None. MRA HEAD FINDINGS Internal carotid arteries:  Patent. Anterior cerebral arteries:  Patent. Middle cerebral arteries: Patent. Anterior communicating artery: Not identified, likely hypoplastic or absent. Posterior communicating arteries:  Patent.  Fetal left PCA. Posterior cerebral arteries:  Patent. Basilar artery:  Patent. Vertebral arteries: Patent. Left dominant. Right vertebral artery terminates in right PICA. Persistent right trigeminal artery variant anatomy. No evidence of high-grade stenosis, large vessel  occlusion, or aneurysm. IMPRESSION: MRI head: 1. 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 No associated hemorrhage or mass effect. Additional punctate focus in left anterior insula. 2. 12 mm subacute infarction within right brachium pontis. 3. Advanced chronic microvascular ischemic changes and moderate volume loss of the brain. 4. Multiple foci of chronic microhemorrhage with predominantly central distribution favoring sequelae of chronic hypertension. 5. Left paramedian frontoparietal region arachnoid cyst. MRA head: 1. No large vessel occlusion, aneurysm, or significant stenosis is identified. 2. Incidental persistent right trigeminal artery noted. These results will be called to the ordering clinician or representative by the Radiologist Assistant, and communication documented in the PACS or zVision Dashboard. Electronically Signed   By: Mitzi Hansen M.D.   On: 08/18/2018 19:44   Mr Maxine Glenn Head Wo Contrast  Result Date: 08/18/2018 CLINICAL DATA:  62 y/o  F; found down.  Stroke follow-up. EXAM: MRI HEAD WITHOUT CONTRAST MRA HEAD WITHOUT CONTRAST TECHNIQUE: Multiplanar, multiecho pulse sequences of the brain and surrounding structures were obtained without intravenous contrast. Angiographic images of the  head were obtained using MRA technique without contrast. COMPARISON:  08/18/2018 CT head. FINDINGS: MRI HEAD FINDINGS Brain: Focus of reduced diffusion of left putamen, corona radiata, and caudate body measuring 2.5 x 1.2 x 2.5 cm (volume = 3.9 cm^3) (AP x ML x CC series 3, image 38 and series 5, image 17) compatible with acute/early subacute infarction. Additional punctate focus of reduced diffusion within the right anterior insula. 12 mm focus of intermediate diffusion within the right brachium pontis compatible with subacute infarct (series 3, image 19). Patchy comp nonspecific T2 FLAIR hyperintensities in subcortical and  periventricular white matter as well as the pons are compatible with advanced chronic microvascular ischemic changes for age. Moderate to severe volume loss of the brain. 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 Punctate foci of susceptibility hypointensity compatible with hemosiderin deposition of chronic microhemorrhage are present scattered throughout the brain in a predominantly central distribution. Vascular: As below. Skull and upper cervical spine: Normal marrow signal. Sinuses/Orbits: Negative. Other: None. MRA HEAD FINDINGS Internal carotid arteries:  Patent. Anterior cerebral arteries:  Patent. Middle cerebral arteries: Patent. Anterior communicating artery: Not identified, likely hypoplastic or absent. Posterior communicating arteries:  Patent.  Fetal left PCA. Posterior cerebral arteries:  Patent. Basilar artery:  Patent. Vertebral arteries: Patent. Left dominant. Right vertebral artery terminates in right PICA. Persistent right trigeminal artery variant anatomy. No evidence of high-grade stenosis, large vessel occlusion, or aneurysm. IMPRESSION: MRI head: 1. 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 No associated hemorrhage or mass effect. Additional punctate focus in left anterior insula. 2. 12 mm subacute infarction within right brachium pontis. 3. Advanced chronic microvascular ischemic changes and moderate volume loss of the brain. 4. Multiple foci of chronic microhemorrhage with predominantly central distribution favoring sequelae of chronic hypertension. 5. Left paramedian frontoparietal region arachnoid cyst. MRA head: 1. No large vessel occlusion, aneurysm, or significant stenosis is identified. 2. Incidental persistent right trigeminal artery noted. These results will be called  to the ordering clinician or representative by the Radiologist Assistant, and communication documented in the PACS or zVision Dashboard. Electronically Signed   By: Mitzi HansenLance  Furusawa-Stratton M.D.   On: 08/18/2018 19:44   [facility] [place] [facility]id [facility]  Result Date: 08/19/2018 Carotid Arterial Duplex Study Indications: CVA. Limitations: limited visualization- shadowing Performing Technologist: Blanch MediaMegan Riddle RVS  Examination Guidelines: A complete evaluation includes B-mode imaging, spectral Doppler, color Doppler, and power Doppler as needed of all accessible portions of each vessel. Bilateral testing is considered an integral part of a complete examination. Limited examinations for reoccurring indications may be performed as noted.  Right Carotid Findings: +----------+--------+--------+--------+------------+--------+           PSV cm/sEDV cm/sStenosisDescribe    Comments +----------+--------+--------+--------+------------+--------+ CCA Prox  138     12              heterogenous         +----------+--------+--------+--------+------------+--------+ CCA Distal111     9               heterogenous         +----------+--------+--------+--------+------------+--------+ ICA Prox  80      16      1-39%   heterogenous         +----------+--------+--------+--------+------------+--------+ ICA Distal65  12                                   +----------+--------+--------+--------+------------+--------+ ECA       69                                           +----------+--------+--------+--------+------------+--------+ +----------+--------+-------+--------+-------------------+           PSV cm/sEDV cmsDescribeArm Pressure (mmHG) +----------+--------+-------+--------+-------------------+ [id number]                                        +----------+--------+-------+--------+-------------------+ +---------+--------+--+--------+-+---------+ VertebralPSV cm/s83EDV  cm/s6Antegrade +---------+--------+--+--------+-+---------+  Left Carotid Findings: +----------+--------+--------+--------+------------+--------------+           PSV cm/sEDV cm/sStenosisDescribe    Comments       +----------+--------+--------+--------+------------+--------------+ CCA Prox  127     15              heterogenous               +----------+--------+--------+--------+------------+--------------+ CCA Distal125     11              heterogenous               +----------+--------+--------+--------+------------+--------------+ ICA Prox  34      6       1-39%   heterogenous               +----------+--------+--------+--------+------------+--------------+ ICA Distal                                    not visualized +----------+--------+--------+--------+------------+--------------+ ECA       92                                                 +----------+--------+--------+--------+------------+--------------+ +----------+--------+--------+--------+-------------------+ SubclavianPSV cm/sEDV cm/sDescribeArm Pressure (mmHG) +----------+--------+--------+--------+-------------------+           198                                         +----------+--------+--------+--------+-------------------+ +---------+--------+--+--------+-+---------+ VertebralPSV cm/s57EDV cm/s9Antegrade +---------+--------+--+--------+-+---------+  Summary: Right Carotid: Velocities in the right ICA are consistent with a 1-39% stenosis. Left Carotid: Velocities in the left ICA are consistent with a 1-39% stenosis. Vertebrals: Bilateral vertebral arteries demonstrate antegrade flow. *See table(s) above for measurements and observations.  Electronically signed by Delia Heady MD on 08/19/2018 at 2:26:32 PM.    Final      LOS: 6 days   Jeoffrey Massed, MD  [facility]  If 7PM-7AM, please contact night-coverage  Please page via [email or web address]-click on MD  name and type text message  08/24/2018, 1:00 PM

## 2018-08-25 LAB — GLUCOSE, CAPILLARY
Glucose-Capillary: 181 mg/dL — ABNORMAL HIGH (ref 70–99)
Glucose-Capillary: 145 mg/dL — ABNORMAL HIGH (ref 70–99)
Glucose-Capillary: 151 mg/dL — ABNORMAL HIGH (ref 70–99)
Glucose-Capillary: 141 mg/dL — ABNORMAL HIGH (ref 70–99)

## 2018-08-25 MED ORDER — AMLODIPINE BESYLATE 5 MG PO TABS: 5.0000 mg | ORAL_TABLET | Freq: Every day | ORAL | 0 refills | Status: DC

## 2018-08-25 MED ORDER — ATORVASTATIN CALCIUM 40 MG PO TABS: 40.0000 mg | ORAL_TABLET | Freq: Every day | ORAL | 0 refills | Status: AC

## 2018-08-25 MED ORDER — POLYETHYLENE GLYCOL 3350 17 G PO PACK
17.0000 g | PACK | Freq: Every day | ORAL | Status: DC
  Administered 2018-08-25 – 2018-08-26 (×2): 17 g via ORAL
  Filled 2018-08-25 (×2): qty 1

## 2018-08-25 MED ORDER — BISACODYL 5 MG PO TBEC: 5.0000 mg | DELAYED_RELEASE_TABLET | Freq: Every day | ORAL | Status: DC | PRN

## 2018-08-25 MED ORDER — POLYETHYLENE GLYCOL 3350 17 G PO PACK: 17.0000 g | PACK | Freq: Every day | ORAL | 0 refills | Status: AC

## 2018-08-25 MED ORDER — SENNOSIDES-DOCUSATE SODIUM 8.6-50 MG PO TABS: 1.0000 | ORAL_TABLET | Freq: Every evening | ORAL | 0 refills | Status: AC | PRN

## 2018-08-25 MED ORDER — GLIMEPIRIDE 2 MG PO TABS: 2.0000 mg | ORAL_TABLET | Freq: Every day | ORAL | 0 refills | Status: AC

## 2018-08-25 MED ORDER — ASPIRIN 325 MG PO TABS: 325.0000 mg | ORAL_TABLET | Freq: Every day | ORAL | 0 refills | Status: AC

## 2018-08-25 NOTE — Discharge Summary (Addendum)
PATIENT DETAILS Name: Catherine Boyd Age: 62 y.o. Sex: female Date of Birth: [birth year] MRN: [id number]. Admitting Physician: Elyse Hsu, MD WUJ:[id number], Windy Fast, MD  Admit Date: 08/18/2018 Discharge date: 08/26/2018  Recommendations for Outpatient Follow-up:  1. Follow up with PCP in 1-2 weeks 2. Please obtain BMP/CBC in one week 3. Please ensure follow up-with cardiology, neurology  Admitted From:  Home  Disposition: SNF   Home Health: No  Equipment/Devices: None  Discharge Condition: Stable  CODE STATUS: FULL CODE  Diet recommendation:  Heart Healthy / Carb Modified   Brief Summary: See H&P, Labs, Consult and Test reports for all details in brief,Patient is a 61 y.o. female with history of DM-2, dyslipidemia, hypertension-to the emergency room on 11/25 after being found down at home-further evaluation revealed a acute CVA and mild rhabdomyolysis.  See below for further details  Brief Hospital Course: Acute CVA: Suspected embolic CVA-MRI brain without any large vessel occlusion, carotid Doppler without any stenosis.  TEE negative for thrombus.  Telemetry without A. fib.  Family does not want to pursue loop recorder implantation during this hospital stay-they have elected to do this at [place].  Continue aspirin and statin.  A1c 6.6, LDL 75.  Plans are for SNF placement in [place]-closer to family.  Awaiting SNF bed-social worker following remains stable for discharge.  Rhabdomyolysis: Secondary to being down-very mild.  CK levels are already down trended.  Follow periodically.    AKI: Significantly improved-etiology likely hemodynamically mediated.  Recheck electrolytes periodically.    Hypertension: Controlled-continue amlodipine.  Dyslipidemia: Continue statin.  DM-2: CBG stable with SSI-plans are to resume oral hypoglycemics on discharge.  Procedures/Studies: 11/27>>TEE: - No cardiac source of emboli was indentified.   Discharge Diagnoses:   Principal Problem:   CVA (cerebral vascular accident) (HCC) Active Problems:   Chronic kidney disease, stage III (moderate) (HCC)   Diabetes mellitus (HCC)   Hypertension   AKI (acute kidney injury) (HCC)   Acute CVA (cerebrovascular accident) (HCC)   LADA (latent autoimmune diabetes in adults), managed as type 2 (HCC)   Traumatic rhabdomyolysis (HCC)   Leukocytosis   Discharge Instructions:  Activity:  As tolerated with Full fall precautions use walker/cane & assistance as needed   Discharge Instructions    Diet - low sodium heart healthy   Complete by:  As directed    Diet Carb Modified   Complete by:  As directed    Discharge instructions   Complete by:  As directed    Check CBGs before meals and at bedtime  Follow with Primary MD in 1 week  Follow with a cardiologist of your choice for loop recorder implantation  Follow with neurologist of your choice for post hospital discharge visit for acute CVA  Please get a complete blood count and chemistry panel checked by your Primary MD at your next visit, and again as instructed by your Primary MD.  Get Medicines reviewed and adjusted: Please take all your medications with you for your next visit with your Primary MD  Laboratory/radiological data: Please request your Primary MD to go over all hospital tests and procedure/radiological results at the follow up, please ask your Primary MD to get all Hospital records sent to his/her office.  In some cases, they will be blood work, cultures and biopsy results pending at the time of your discharge. Please request that your primary care M.D. follows up on these results.  Also Note the following: If you experience worsening of your admission symptoms, develop  shortness of breath, life threatening emergency, suicidal or homicidal thoughts you must seek medical attention immediately by calling 911 or calling your MD immediately  if symptoms less severe.  You must read complete  instructions/literature along with all the possible adverse reactions/side effects for all the Medicines you take and that have been prescribed to you. Take any new Medicines after you have completely understood and accpet all the possible adverse reactions/side effects.   Do not drive when taking Pain medications or sleeping medications (Benzodaizepines)  Do not take more than prescribed Pain, Sleep and Anxiety Medications. It is not advisable to combine anxiety,sleep and pain medications without talking with your primary care practitioner  Special Instructions: If you have smoked or chewed Tobacco  in the last 2 yrs please stop smoking, stop any regular Alcohol  and or any Recreational drug use.  Wear Seat belts while driving.  Please note: You were cared for by a hospitalist during your hospital stay. Once you are discharged, your primary care physician will handle any further medical issues. Please note that NO REFILLS for any discharge medications will be authorized once you are discharged, as it is imperative that you return to your primary care physician (or establish a relationship with a primary care physician if you do not have one) for your post hospital discharge needs so that they can reassess your need for medications and monitor your lab values.   Increase activity slowly   Complete by:  As directed      Allergies as of 08/26/2018      Reactions   Penicillins Other (See Comments)   fainted      Medication List    STOP taking these medications   losartan 50 MG tablet Commonly known as:  COZAAR     TAKE these medications   amLODipine 5 MG tablet Commonly known as:  NORVASC Take 1 tablet (5 mg total) by mouth daily.   aspirin 325 MG tablet Take 1 tablet (325 mg total) by mouth daily.   atorvastatin 40 MG tablet Commonly known as:  LIPITOR Take 1 tablet (40 mg total) by mouth daily. What changed:    medication strength  how much to take   glimepiride 2 MG  tablet Commonly known as:  AMARYL Take 1 tablet (2 mg total) by mouth daily with breakfast.   polyethylene glycol packet Commonly known as:  MIRALAX / GLYCOLAX Take 17 g by mouth daily.   senna-docusate 8.6-50 MG tablet Commonly known as:  Senokot-S Take 1 tablet by mouth at bedtime as needed for mild constipation or moderate constipation.      Follow-up Information    Primary MD. Schedule an appointment as soon as possible for a visit in 1 week(s).        Cardiologist of your choice Follow up.   Why:  for loop recorder implantation       Neurologist of your choice Follow up.   Why:  for post hospital discharge visit-has CVA         Allergies  Allergen Reactions  . Penicillins Other (See Comments)    fainted    Consultations:   neurology  Other Procedures/Studies: Ct Head Wo Contrast  Result Date: 08/18/2018 CLINICAL DATA:  62 year old female found down.  Initial encounter. EXAM: CT HEAD WITHOUT CONTRAST TECHNIQUE: Contiguous axial images were obtained from the base of the skull through the vertex without intravenous contrast. COMPARISON:  None. FINDINGS: Brain: Acute nonhemorrhagic infarct suspected involving the left corona radiata/lenticular  nucleus. Remote small anteromedial left thalamic infarct. Prominent chronic microvascular changes. No intracranial hemorrhage. Global atrophy. Slight asymmetry lateral ventricles without mass seen causing this appearance. The ventricular size is most likely related to atrophy rather hydrocephalus. Aqueduct is patent. No intracranial mass lesion noted on this unenhanced exam. Vascular: Vascular calcifications. Intracranial vasculature is ectatic with calcified plaque cavernous segment internal carotid arteries and portion of vertebral arteries. Skull: No skull fracture Sinuses/Orbits: No acute orbital abnormality. Visualized paranasal sinuses are clear. Other: Mastoid air cells and middle ear cavities are clear. IMPRESSION: 1. Acute  nonhemorrhagic infarct suspected involving the left corona radiata/lenticular nucleus. 2. Remote small anteromedial left thalamic infarct. 3. Prominent chronic microvascular changes. 4. No intracranial hemorrhage. 5. Global atrophy. These results were called by telephone at the time of interpretation on 08/18/2018 at 12:31 pm to Dr. Azalia Bilis , who verbally acknowledged these results. Electronically Signed   By: Lacy Duverney M.D.   On: 08/18/2018 12:40   Mr Brain Wo Contrast  Result Date: 08/18/2018 CLINICAL DATA:  62 y/o  F; found down.  Stroke follow-up. EXAM: MRI HEAD WITHOUT CONTRAST MRA HEAD WITHOUT CONTRAST TECHNIQUE: Multiplanar, multiecho pulse sequences of the brain and surrounding structures were obtained without intravenous contrast. Angiographic images of the head were obtained using MRA technique without contrast. COMPARISON:  08/18/2018 CT head. FINDINGS: MRI HEAD FINDINGS Brain: Focus of reduced diffusion of left putamen, corona radiata, and caudate body measuring 2.5 x 1.2 x 2.5 cm (volume = 3.9 cm^3) (AP x ML x CC series 3, image 38 and series 5, image 17) compatible with acute/early subacute infarction. Additional punctate focus of reduced diffusion within the right anterior insula. 12 mm focus of intermediate diffusion within the right brachium pontis compatible with subacute infarct (series 3, image 19). Patchy comp nonspecific T2 FLAIR hyperintensities in subcortical and periventricular white matter as well as the pons are compatible with advanced chronic microvascular ischemic changes for age. Moderate to severe volume loss of the brain. 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 Punctate foci of susceptibility hypointensity compatible with hemosiderin deposition of chronic microhemorrhage are present scattered throughout the brain in a  predominantly central distribution. Vascular: As below. Skull and upper cervical spine: Normal marrow signal. Sinuses/Orbits: Negative. Other: None. MRA HEAD FINDINGS Internal carotid arteries:  Patent. Anterior cerebral arteries:  Patent. Middle cerebral arteries: Patent. Anterior communicating artery: Not identified, likely hypoplastic or absent. Posterior communicating arteries:  Patent.  Fetal left PCA. Posterior cerebral arteries:  Patent. Basilar artery:  Patent. Vertebral arteries: Patent. Left dominant. Right vertebral artery terminates in right PICA. Persistent right trigeminal artery variant anatomy. No evidence of high-grade stenosis, large vessel occlusion, or aneurysm. IMPRESSION: MRI head: 1. 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 No associated hemorrhage or mass effect. Additional punctate focus in left anterior insula. 2. 12 mm subacute infarction within right brachium pontis. 3. Advanced chronic microvascular ischemic changes and moderate volume loss of the brain. 4. Multiple foci of chronic microhemorrhage with predominantly central distribution favoring sequelae of chronic hypertension. 5. Left paramedian frontoparietal region arachnoid cyst. MRA head: 1. No large vessel occlusion, aneurysm, or significant stenosis is identified. 2. Incidental persistent right trigeminal artery noted. These results will be called to the ordering clinician or representative by the Radiologist Assistant, and  communication documented in the PACS or zVision Dashboard. Electronically Signed   By: Mitzi Hansen M.D.   On: 08/18/2018 19:44   Mr Maxine Glenn Head Wo Contrast  Result Date: 08/18/2018 CLINICAL DATA:  62 y/o  F; found down.  Stroke follow-up. EXAM: MRI HEAD WITHOUT CONTRAST MRA HEAD WITHOUT CONTRAST TECHNIQUE: Multiplanar, multiecho pulse sequences of the brain and surrounding structures were obtained without intravenous  contrast. Angiographic images of the head were obtained using MRA technique without contrast. COMPARISON:  08/18/2018 CT head. FINDINGS: MRI HEAD FINDINGS Brain: Focus of reduced diffusion of left putamen, corona radiata, and caudate body measuring 2.5 x 1.2 x 2.5 cm (volume = 3.9 cm^3) (AP x ML x CC series 3, image 38 and series 5, image 17) compatible with acute/early subacute infarction. Additional punctate focus of reduced diffusion within the right anterior insula. 12 mm focus of intermediate diffusion within the right brachium pontis compatible with subacute infarct (series 3, image 19). Patchy comp nonspecific T2 FLAIR hyperintensities in subcortical and periventricular white matter as well as the pons are compatible with advanced chronic microvascular ischemic changes for age. Moderate to severe volume loss of the brain. 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 Punctate foci of susceptibility hypointensity compatible with hemosiderin deposition of chronic microhemorrhage are present scattered throughout the brain in a predominantly central distribution. Vascular: As below. Skull and upper cervical spine: Normal marrow signal. Sinuses/Orbits: Negative. Other: None. MRA HEAD FINDINGS Internal carotid arteries:  Patent. Anterior cerebral arteries:  Patent. Middle cerebral arteries: Patent. Anterior communicating artery: Not identified, likely hypoplastic or absent. Posterior communicating arteries:  Patent.  Fetal left PCA. Posterior cerebral arteries:  Patent. Basilar artery:  Patent. Vertebral arteries: Patent. Left dominant. Right vertebral artery terminates in right PICA. Persistent right trigeminal artery variant anatomy. No evidence of high-grade stenosis, large vessel occlusion, or aneurysm. IMPRESSION: MRI head: 1. Acute/early subacute infarction within the  left basal ganglia and corona radiata measuring up to 2.5 cm, 3.9 cc. No associated hemorrhage or mass effect. Additional punctate focus in left anterior insula. 2. 12 mm subacute infarction within right brachium pontis. 3. Advanced chronic microvascular ischemic changes and moderate volume loss of the brain. 4. Multiple foci of chronic microhemorrhage with predominantly central distribution favoring sequelae of chronic hypertension. 5. Left paramedian frontoparietal region arachnoid cyst. MRA head: 1. No large vessel occlusion, aneurysm, or significant stenosis is identified. 2. Incidental persistent right trigeminal artery noted. These results will be called to the ordering clinician or representative by the Radiologist Assistant, and communication documented in the PACS or zVision Dashboard. Electronically Signed   By: Mitzi Hansen M.D.   On: 08/18/2018 19:44   Vas US Carotid (at [facility] Only)  Result Date: 08/19/2018 Carotid Arterial Duplex Study Indications: CVA. Limitations: limited visualization- shadowing Performing Technologist: Blanch Media RVS  Examination Guidelines: A complete evaluation includes B-mode imaging, spectral Doppler, color Doppler, and power Doppler as needed of all accessible portions of each vessel. Bilateral testing is considered an integral part of a complete examination. Limited examinations for reoccurring indications may be performed as noted.  Right Carotid Findings: +----------+--------+--------+--------+------------+--------+           PSV cm/sEDV cm/sStenosisDescribe    Comments +----------+--------+--------+--------+------------+--------+ CCA Prox  138     12  heterogenous         +----------+--------+--------+--------+------------+--------+ CCA Distal111     9               heterogenous         +----------+--------+--------+--------+------------+--------+ ICA Prox  80      16      1-39%   heterogenous          +----------+--------+--------+--------+------------+--------+ ICA Distal65      12                                   +----------+--------+--------+--------+------------+--------+ ECA       69                                           +----------+--------+--------+--------+------------+--------+ +----------+--------+-------+--------+-------------------+           PSV cm/sEDV cmsDescribeArm Pressure (mmHG) +----------+--------+-------+--------+-------------------+ [id number]                                        +----------+--------+-------+--------+-------------------+ +---------+--------+--+--------+-+---------+ VertebralPSV cm/s83EDV cm/s6Antegrade +---------+--------+--+--------+-+---------+  Left Carotid Findings: +----------+--------+--------+--------+------------+--------------+           PSV cm/sEDV cm/sStenosisDescribe    Comments       +----------+--------+--------+--------+------------+--------------+ CCA Prox  127     15              heterogenous               +----------+--------+--------+--------+------------+--------------+ CCA Distal125     11              heterogenous               +----------+--------+--------+--------+------------+--------------+ ICA Prox  34      6       1-39%   heterogenous               +----------+--------+--------+--------+------------+--------------+ ICA Distal                                    not visualized +----------+--------+--------+--------+------------+--------------+ ECA       92                                                 +----------+--------+--------+--------+------------+--------------+ +----------+--------+--------+--------+-------------------+ SubclavianPSV cm/sEDV cm/sDescribeArm Pressure (mmHG) +----------+--------+--------+--------+-------------------+           198                                         +----------+--------+--------+--------+-------------------+  +---------+--------+--+--------+-+---------+ VertebralPSV cm/s57EDV cm/s9Antegrade +---------+--------+--+--------+-+---------+  Summary: Right Carotid: Velocities in the right ICA are consistent with a 1-39% stenosis. Left Carotid: Velocities in the left ICA are consistent with a 1-39% stenosis. Vertebrals: Bilateral vertebral arteries demonstrate antegrade flow. *See table(s) above for measurements and observations.  Electronically signed by Delia HeadyPramod Sethi MD on 08/19/2018 at 2:26:32 PM.    Final      TODAY-DAY OF DISCHARGE:  Subjective:   Catherine Boyd today  has no headache,no chest abdominal pain,no new weakness tingling or numbness, feels much better wants to go home today.   Objective:   Blood pressure (!) 166/86, pulse 85, temperature 98.6 F (37 C), temperature source Oral, resp. rate 16, height 5\' 9"  (1.753 m), weight 85.5 kg, SpO2 99 %.  Intake/Output Summary (Last 24 hours) at 08/26/2018 0811 Last data filed at 08/25/2018 0933 Gross per 24 hour  Intake -  Output 200 ml  Net -200 ml   Filed Weights   08/18/18 2153 08/20/18 1135  Weight: 85.5 kg 85.5 kg    Exam: Awake Alert, Oriented *3, No new F.N deficits, Normal affect [place].AT,PERRAL Supple Neck,No JVD, No cervical lymphadenopathy appriciated.  Symmetrical Chest wall movement, Good air movement bilaterally, CTAB RRR,No Gallops,Rubs or new Murmurs, No Parasternal Heave +ve B.Sounds, Abd Soft, Non tender, No organomegaly appriciated, No rebound -guarding or rigidity. No Cyanosis, Clubbing or edema, No new Rash or bruise   PERTINENT RADIOLOGIC STUDIES: Ct Head Wo Contrast  Result Date: 08/18/2018 CLINICAL DATA:  62 year old female found down.  Initial encounter. EXAM: CT HEAD WITHOUT CONTRAST TECHNIQUE: Contiguous axial images were obtained from the base of the skull through the vertex without intravenous contrast. COMPARISON:  None. FINDINGS: Brain: Acute nonhemorrhagic infarct suspected involving the left corona  radiata/lenticular nucleus. Remote small anteromedial left thalamic infarct. Prominent chronic microvascular changes. No intracranial hemorrhage. Global atrophy. Slight asymmetry lateral ventricles without mass seen causing this appearance. The ventricular size is most likely related to atrophy rather hydrocephalus. Aqueduct is patent. No intracranial mass lesion noted on this unenhanced exam. Vascular: Vascular calcifications. Intracranial vasculature is ectatic with calcified plaque cavernous segment internal carotid arteries and portion of vertebral arteries. Skull: No skull fracture Sinuses/Orbits: No acute orbital abnormality. Visualized paranasal sinuses are clear. Other: Mastoid air cells and middle ear cavities are clear. IMPRESSION: 1. Acute nonhemorrhagic infarct suspected involving the left corona radiata/lenticular nucleus. 2. Remote small anteromedial left thalamic infarct. 3. Prominent chronic microvascular changes. 4. No intracranial hemorrhage. 5. Global atrophy. These results were called by telephone at the time of interpretation on 08/18/2018 at 12:31 pm to Dr. Azalia Bilis , who verbally acknowledged these results. Electronically Signed   By: Lacy Duverney M.D.   On: 08/18/2018 12:40   Mr Brain Wo Contrast  Result Date: 08/18/2018 CLINICAL DATA:  62 y/o  F; found down.  Stroke follow-up. EXAM: MRI HEAD WITHOUT CONTRAST MRA HEAD WITHOUT CONTRAST TECHNIQUE: Multiplanar, multiecho pulse sequences of the brain and surrounding structures were obtained without intravenous contrast. Angiographic images of the head were obtained using MRA technique without contrast. COMPARISON:  08/18/2018 CT head. FINDINGS: MRI HEAD FINDINGS Brain: Focus of reduced diffusion of left putamen, corona radiata, and caudate body measuring 2.5 x 1.2 x 2.5 cm (volume = 3.9 cm^3) (AP x ML x CC series 3, image 38 and series 5, image 17) compatible with acute/early subacute infarction. Additional punctate focus of reduced  diffusion within the right anterior insula. 12 mm focus of intermediate diffusion within the right brachium pontis compatible with subacute infarct (series 3, image 19). Patchy comp nonspecific T2 FLAIR hyperintensities in subcortical and periventricular white matter as well as the pons are compatible with advanced chronic microvascular ischemic changes for age. Moderate to severe volume loss of the brain. 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 Punctate foci of  susceptibility hypointensity compatible with hemosiderin deposition of chronic microhemorrhage are present scattered throughout the brain in a predominantly central distribution. Vascular: As below. Skull and upper cervical spine: Normal marrow signal. Sinuses/Orbits: Negative. Other: None. MRA HEAD FINDINGS Internal carotid arteries:  Patent. Anterior cerebral arteries:  Patent. Middle cerebral arteries: Patent. Anterior communicating artery: Not identified, likely hypoplastic or absent. Posterior communicating arteries:  Patent.  Fetal left PCA. Posterior cerebral arteries:  Patent. Basilar artery:  Patent. Vertebral arteries: Patent. Left dominant. Right vertebral artery terminates in right PICA. Persistent right trigeminal artery variant anatomy. No evidence of high-grade stenosis, large vessel occlusion, or aneurysm. IMPRESSION: MRI head: 1. 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 No associated hemorrhage or mass effect. Additional punctate focus in left anterior insula. 2. 12 mm subacute infarction within right brachium pontis. 3. Advanced chronic microvascular ischemic changes and moderate volume loss of the brain. 4. Multiple foci of chronic microhemorrhage with predominantly central distribution favoring sequelae of chronic  hypertension. 5. Left paramedian frontoparietal region arachnoid cyst. MRA head: 1. No large vessel occlusion, aneurysm, or significant stenosis is identified. 2. Incidental persistent right trigeminal artery noted. These results will be called to the ordering clinician or representative by the Radiologist Assistant, and communication documented in the PACS or zVision Dashboard. Electronically Signed   By: Mitzi Hansen M.D.   On: 08/18/2018 19:44   Mr Maxine Glenn Head Wo Contrast  Result Date: 08/18/2018 CLINICAL DATA:  62 y/o  F; found down.  Stroke follow-up. EXAM: MRI HEAD WITHOUT CONTRAST MRA HEAD WITHOUT CONTRAST TECHNIQUE: Multiplanar, multiecho pulse sequences of the brain and surrounding structures were obtained without intravenous contrast. Angiographic images of the head were obtained using MRA technique without contrast. COMPARISON:  08/18/2018 CT head. FINDINGS: MRI HEAD FINDINGS Brain: Focus of reduced diffusion of left putamen, corona radiata, and caudate body measuring 2.5 x 1.2 x 2.5 cm (volume = 3.9 cm^3) (AP x ML x CC series 3, image 38 and series 5, image 17) compatible with acute/early subacute infarction. Additional punctate focus of reduced diffusion within the right anterior insula. 12 mm focus of intermediate diffusion within the right brachium pontis compatible with subacute infarct (series 3, image 19). Patchy comp nonspecific T2 FLAIR hyperintensities in subcortical and periventricular white matter as well as the pons are compatible with advanced chronic microvascular ischemic changes for age. Moderate to severe volume loss of the brain. 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 Punctate foci of susceptibility hypointensity compatible with hemosiderin deposition of chronic microhemorrhage are present scattered throughout the brain in a  predominantly central distribution. Vascular: As below. Skull and upper cervical spine: Normal marrow signal. Sinuses/Orbits: Negative. Other: None. MRA HEAD FINDINGS Internal carotid arteries:  Patent. Anterior cerebral arteries:  Patent. Middle cerebral arteries: Patent. Anterior communicating artery: Not identified, likely hypoplastic or absent. Posterior communicating arteries:  Patent.  Fetal left PCA. Posterior cerebral arteries:  Patent. Basilar artery:  Patent. Vertebral arteries: Patent. Left dominant. Right vertebral artery terminates in right PICA. Persistent right trigeminal artery variant anatomy. No evidence of high-grade stenosis, large vessel occlusion, or aneurysm. IMPRESSION: MRI head: 1. 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 No associated hemorrhage or mass effect. Additional punctate focus in left anterior  insula. 2. 12 mm subacute infarction within right brachium pontis. 3. Advanced chronic microvascular ischemic changes and moderate volume loss of the brain. 4. Multiple foci of chronic microhemorrhage with predominantly central distribution favoring sequelae of chronic hypertension. 5. Left paramedian frontoparietal region arachnoid cyst. MRA head: 1. No large vessel occlusion, aneurysm, or significant stenosis is identified. 2. Incidental persistent right trigeminal artery noted. These results will be called to the ordering clinician or representative by the Radiologist Assistant, and communication documented in the PACS or zVision Dashboard. Electronically Signed   By: Mitzi Hansen M.D.   On: 08/18/2018 19:44   Vas US Carotid (at [facility] Only)  Result Date: 08/19/2018 Carotid Arterial Duplex Study Indications: CVA. Limitations: limited visualization- shadowing Performing Technologist: Blanch Media RVS  Examination Guidelines: A complete evaluation includes B-mode imaging, spectral Doppler, color  Doppler, and power Doppler as needed of all accessible portions of each vessel. Bilateral testing is considered an integral part of a complete examination. Limited examinations for reoccurring indications may be performed as noted.  Right Carotid Findings: +----------+--------+--------+--------+------------+--------+           PSV cm/sEDV cm/sStenosisDescribe    Comments +----------+--------+--------+--------+------------+--------+ CCA Prox  138     12              heterogenous         +----------+--------+--------+--------+------------+--------+ CCA Distal111     9               heterogenous         +----------+--------+--------+--------+------------+--------+ ICA Prox  80      16      1-39%   heterogenous         +----------+--------+--------+--------+------------+--------+ ICA Distal65      12                                   +----------+--------+--------+--------+------------+--------+ ECA       69                                           +----------+--------+--------+--------+------------+--------+ +----------+--------+-------+--------+-------------------+           PSV cm/sEDV cmsDescribeArm Pressure (mmHG) +----------+--------+-------+--------+-------------------+ [id number]                                        +----------+--------+-------+--------+-------------------+ +---------+--------+--+--------+-+---------+ VertebralPSV cm/s83EDV cm/s6Antegrade +---------+--------+--+--------+-+---------+  Left Carotid Findings: +----------+--------+--------+--------+------------+--------------+           PSV cm/sEDV cm/sStenosisDescribe    Comments       +----------+--------+--------+--------+------------+--------------+ CCA Prox  127     15              heterogenous               +----------+--------+--------+--------+------------+--------------+ CCA Distal125     11              heterogenous                +----------+--------+--------+--------+------------+--------------+ ICA Prox  34      6       1-39%   heterogenous               +----------+--------+--------+--------+------------+--------------+ ICA Distal  not visualized +----------+--------+--------+--------+------------+--------------+ ECA       92                                                 +----------+--------+--------+--------+------------+--------------+ +----------+--------+--------+--------+-------------------+ SubclavianPSV cm/sEDV cm/sDescribeArm Pressure (mmHG) +----------+--------+--------+--------+-------------------+           198                                         +----------+--------+--------+--------+-------------------+ +---------+--------+--+--------+-+---------+ VertebralPSV cm/s57EDV cm/s9Antegrade +---------+--------+--+--------+-+---------+  Summary: Right Carotid: Velocities in the right ICA are consistent with a 1-39% stenosis. Left Carotid: Velocities in the left ICA are consistent with a 1-39% stenosis. Vertebrals: Bilateral vertebral arteries demonstrate antegrade flow. *See table(s) above for measurements and observations.  Electronically signed by Delia Heady MD on 08/19/2018 at 2:26:32 PM.    Final      PERTINENT LAB RESULTS: CBC: No results for input(s): WBC, HGB, HCT, PLT in the last 72 hours. CMET CMP     Component Value Date/Time   NA 139 08/21/2018 0241   K 3.8 08/21/2018 0241   CL 108 08/21/2018 0241   CO2 24 08/21/2018 0241   GLUCOSE 137 (H) 08/21/2018 0241   BUN 24 (H) 08/21/2018 0241   CREATININE 1.27 (H) 08/21/2018 0241   CALCIUM 8.1 (L) 08/21/2018 0241   PROT 5.9 (L) 08/21/2018 0241   ALBUMIN 3.1 (L) 08/21/2018 0241   AST 32 08/21/2018 0241   ALT 36 08/21/2018 0241   ALKPHOS 63 08/21/2018 0241   BILITOT 0.8 08/21/2018 0241   GFRNONAA 46 (L) 08/21/2018 0241   GFRAA 53 (L) 08/21/2018 0241    GFR Estimated  Creatinine Clearance: 53.6 mL/min (A) (by C-G formula based on SCr of 1.27 mg/dL (H)). No results for input(s): LIPASE, AMYLASE in the last 72 hours. No results for input(s): CKTOTAL, CKMB, CKMBINDEX, TROPONINI in the last 72 hours. Invalid input(s): POCBNP No results for input(s): DDIMER in the last 72 hours. No results for input(s): HGBA1C in the last 72 hours. No results for input(s): CHOL, HDL, LDLCALC, TRIG, CHOLHDL, LDLDIRECT in the last 72 hours. No results for input(s): TSH, T4TOTAL, T3FREE, THYROIDAB in the last 72 hours.  Invalid input(s): FREET3 No results for input(s): VITAMINB12, FOLATE, FERRITIN, TIBC, IRON, RETICCTPCT in the last 72 hours. Coags: No results for input(s): INR in the last 72 hours.  Invalid input(s): PT Microbiology: No results found for this or any previous visit (from the past 240 hour(s)).  FURTHER DISCHARGE INSTRUCTIONS:  Get Medicines reviewed and adjusted: Please take all your medications with you for your next visit with your Primary MD  Laboratory/radiological data: Please request your Primary MD to go over all hospital tests and procedure/radiological results at the follow up, please ask your Primary MD to get all Hospital records sent to his/her office.  In some cases, they will be blood work, cultures and biopsy results pending at the time of your discharge. Please request that your primary care M.D. goes through all the records of your hospital data and follows up on these results.  Also Note the following: If you experience worsening of your admission symptoms, develop shortness of breath, life threatening emergency, suicidal or homicidal thoughts you must seek medical attention immediately by calling 911 or calling your  MD immediately  if symptoms less severe.  You must read complete instructions/literature along with all the possible adverse reactions/side effects for all the Medicines you take and that have been prescribed to you. Take any  new Medicines after you have completely understood and accpet all the possible adverse reactions/side effects.   Do not drive when taking Pain medications or sleeping medications (Benzodaizepines)  Do not take more than prescribed Pain, Sleep and Anxiety Medications. It is not advisable to combine anxiety,sleep and pain medications without talking with your primary care practitioner  Special Instructions: If you have smoked or chewed Tobacco  in the last 2 yrs please stop smoking, stop any regular Alcohol  and or any Recreational drug use.  Wear Seat belts while driving.  Please note: You were cared for by a hospitalist during your hospital stay. Once you are discharged, your primary care physician will handle any further medical issues. Please note that NO REFILLS for any discharge medications will be authorized once you are discharged, as it is imperative that you return to your primary care physician (or establish a relationship with a primary care physician if you do not have one) for your post hospital discharge needs so that they can reassess your need for medications and monitor your lab values.  Total Time spent coordinating discharge including counseling, education and face to face time equals  45 minutes.  SignedJeoffrey Massed 08/26/2018 8:11 AM

## 2018-08-25 NOTE — Progress Notes (Signed)
Physical Therapy Treatment Patient Details Name: Catherine Boyd MRN: [id number] DOB: 01/31/1956 Today's Date: 08/25/2018    History of Present Illness pt is a 61 y/o female with PMH significant for HTN, DM, presenting to ED after having been found down at home, having been down all weekend.  MRI showed Acute/early subacute infart within the L BG and corona radiate pluc additional puctate focus in left anterior insula.  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    PT Comments    Pt in bed with family member at bedside. Pt in pleasant mood, stating it was [birth year] birthday today. All tasks required increased time as pt is easily distracted. Pt participated in bed mobility, transfers, gait with 2 person HHA and dynamic sitting balance. Pt required Mod A for all bed mobility and Mod A +2 for transfers. Pt required Max +2 when taking steps toward chair. I have discussed the patient's current level of function related to deficits (listed below) with the patient and sister. They acknowledged understanding of this and do not feel the pt would be able to have her care needs met at home.  They are interested in post-acute rehab in an inpatient setting. Pt would benefit from continued PT in order to progress toward goals and maximize independence. Pt remains appropriate for CIR based on current functional status.   Follow Up Recommendations  CIR;Supervision/Assistance - 24 hour     Equipment Recommendations  Other (comment)    Recommendations for Other Services Rehab consult     Precautions / Restrictions Precautions Precautions: Fall Restrictions Weight Bearing Restrictions: No    Mobility  Bed Mobility Overal bed mobility: Needs Assistance Bed Mobility: Rolling;Sidelying to Sit Rolling: Mod assist Sidelying to sit: Mod assist Supine to sit: Mod assist     General bed mobility comments: Roll more difficult due to R arm/shoulder weakness. Pt requried VC to reach for bed rail  and requried assist to progress RLE off EOB  Transfers Overall transfer level: Needs assistance Equipment used: 2 person hand held assist Transfers: Sit to/from Stand Sit to Stand: +2 physical assistance;Mod assist         General transfer comment: Mod Assist to boost into standing and to steady. VC/TC to keep upright posture and head up.  Ambulation/Gait Ambulation/Gait assistance: Max assist;+2 physical assistance Gait Distance (Feet): 8 Feet Assistive device: 2 person hand held assist Gait Pattern/deviations: Decreased step length - right;Decreased step length - left;Decreased stance time - left Gait velocity: extremely slow   General Gait Details: pt taking extremely small steps.  Pt needing weight shift and holds. Facilitation required to advance R LE. VC to progress L LE.    Stairs             Wheelchair Mobility    Modified Rankin (Stroke Patients Only) Modified Rankin (Stroke Patients Only) Pre-Morbid Rankin Score: No symptoms Modified Rankin: Moderately severe disability     Balance Overall balance assessment: Needs assistance Sitting-balance support: Single extremity supported;No upper extremity supported Sitting balance-Leahy Scale: Poor Sitting balance - Comments: pt listing right and posteriorly, but able to make correction when cued. Worked on reaching across midline and leaning foawrd  to touch target. Weight bearing encouraged on RUE. Postural control: Posterior lean;Right lateral lean Standing balance support: Bilateral upper extremity supported Standing balance-Leahy Scale: Poor Standing balance comment: reliant on external assist for standing balance.  Worked on upright, midline standing with control at R knee.  Cognition Arousal/Alertness: Awake/alert Behavior During Therapy: Flat affect Overall Cognitive Status: Impaired/Different from baseline Area of Impairment: Attention;Following  commands;Awareness;Problem solving;Safety/judgement                   Current Attention Level: Sustained   Following Commands: Follows one step commands consistently Safety/Judgement: Decreased awareness of safety;Decreased awareness of deficits Awareness: Emergent Problem Solving: Decreased initiation;Difficulty sequencing;Requires tactile cues;Slow processing;Requires verbal cues General Comments: pt easily distracted; requires multimodal cues      Exercises Total Joint Exercises Heel Slides: AAROM;Right;10 reps;Supine Long Arc Quad: AAROM;Right;10 reps;Seated Other Exercises Other Exercises: PNF diagonals during functional tasks to incorporate trunkad weight shifts in sitting position Other Exercises: rubbing lotion of BLE while weight bearing through RUE to encoruage anterior/posterior weight shifts and midline postural control    General Comments        Pertinent Vitals/Pain Pain Assessment: No/denies pain    Home Living                      Prior Function            PT Goals (current goals can now be found in the care plan section) Acute Rehab PT Goals Patient Stated Goal: none stated this session PT Goal Formulation: With patient Time For Goal Achievement: 09/02/18 Potential to Achieve Goals: Good Progress towards PT goals: Progressing toward goals    Frequency    Min 4X/week      PT Plan Current plan remains appropriate    Co-evaluation PT/OT/SLP Co-Evaluation/Treatment: Yes Reason for Co-Treatment: Complexity of the patient's impairments (multi-system involvement);For patient/therapist safety;To address functional/ADL transfers   OT goals addressed during session: ADL's and self-care;Strengthening/ROM      AM-PAC PT "6 Clicks" Mobility   Outcome Measure    Help needed moving from lying on your back to sitting on the side of a flat bed without using bedrails?: A Lot Help needed moving to and from a bed to a chair (including a  wheelchair)?: A Lot Help needed standing up from a chair using your arms (e.g., wheelchair or bedside chair)?: A Lot Help needed to walk in hospital room?: A Lot Help needed climbing 3-5 steps with a railing? : Total 6 Click Score: 9    End of Session Equipment Utilized During Treatment: Gait belt Activity Tolerance: Patient tolerated treatment well Patient left: in chair;with call bell/phone within reach;with chair alarm set;with family/visitor present Nurse Communication: Mobility status PT Visit Diagnosis: Unsteadiness on feet (R26.81);Hemiplegia and hemiparesis;Other abnormalities of gait and mobility (R26.89) Hemiplegia - Right/Left: Right Hemiplegia - dominant/non-dominant: Dominant Hemiplegia - caused by: Cerebral infarction     Time: 1128-1205 PT Time Calculation (min) (ACUTE ONLY): 37 min  Charges:  $Therapeutic Activity: 8-22 mins                     [address], SPTA  [place] 08/25/2018, 3:47 PM

## 2018-08-25 NOTE — Progress Notes (Signed)
Occupational Therapy Evaluation Patient Details Name: Catherine Boyd MRN: [id number] DOB: 05/02/1956 Today's Date: 08/25/2018    History of Present Illness pt is a 62 y/o female with PMH significant for HTN, DM, presenting to ED after having been found down at home, having been down all weekend.  MRI showed Acute/early subacute infart within the L BG and corona radiate pluc additional puctate focus in left anterior insula.  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   Clinical Impression   Pt making steady progress toward goals. Requires +2 Mod A with limited mobility and able to take @ 5 steps with +2 Max A. Overall Max A with ADL with  Exception of min A for eating and mod A with  grooming. Continue to recommend post acute rehab prior to DC home. If plan is for pt to DC directly home she will need DME as listed below in addition to 24/7 assistance. Will continue to follow acutely.     Follow Up Recommendations  SNF;CIR;Supervision/Assistance - 24 hour(post acute rehab)    Equipment Recommendations  3 in 1 bedside commode (drop arm) Hospital bed; wheelchair with cushion    Recommendations for Other Services       Precautions / Restrictions Precautions Precautions: Fall Restrictions Weight Bearing Restrictions: No      Mobility Bed Mobility Overal bed mobility: Needs Assistance Bed Mobility: Rolling;Sidelying to Sit Rolling: Mod assist Sidelying to sit: Mod assist Supine to sit: Mod assist     General bed mobility comments: Roll more difficult due to R arm/shoulder weakness. Pt requried VC to reach for bed rail and requried assist to progress LE off EOB  Transfers Overall transfer level: Needs assistance Equipment used: 2 person hand held assist Transfers: Sit to/from Stand Sit to Stand: +2 physical assistance;Mod assist         General transfer comment: Mod Assist to boost into standing and to steady. VC/TC to keep upright posture and head up.     Balance Overall balance assessment: Needs assistance Sitting-balance support: Single extremity supported;No upper extremity supported Sitting balance-Leahy Scale: Poor Sitting balance - Comments: pt listing right and posteriorly, but able to make correction when cued. Worked on reaching across midline and leaning foawrd  to touch targets.  Postural control: Posterior lean;Right lateral lean Standing balance support: Bilateral upper extremity supported Standing balance-Leahy Scale: Poor Standing balance comment: reliant on external assist for standing balance.  Worked on upright, midline standing with control at R knee.                           ADL either performed or assessed with clinical judgement   ADL Overall ADL's : Needs assistance/impaired Eating/Feeding: Minimal assistance;Sitting   Grooming: Minimal assistance;Sitting                               Functional mobility during ADLs: +2 for physical assistance;Moderate assistance       Vision   Additional Comments: decreased visual attention     Perception     Praxis      Pertinent Vitals/Pain Pain Assessment: No/denies pain     Hand Dominance     Extremity/Trunk Assessment Upper Extremity Assessment Upper Extremity Assessment: RUE deficits/detail RUE Deficits / Details: isolated movement (minimal) out of synergy pattern; Brunstrom II; cues for incorporation; non-functional RUE Coordination: decreased fine motor;decreased gross motor   Lower Extremity Assessment  Lower Extremity Assessment: Defer to PT evaluation       Communication     Cognition Arousal/Alertness: Awake/alert Behavior During Therapy: Flat affect Overall Cognitive Status: Impaired/Different from baseline Area of Impairment: Attention;Following commands;Awareness;Problem solving;Safety/judgement                   Current Attention Level: Sustained   Following Commands: Follows one step commands  consistently Safety/Judgement: Decreased awareness of safety;Decreased awareness of deficits Awareness: Emergent Problem Solving: Decreased initiation;Difficulty sequencing;Requires tactile cues;Slow processing;Requires verbal cues General Comments: pt easily distracted; requires multimodal cues   General Comments       Exercises Exercises: Other exercises Total Joint Exercises Heel Slides: AAROM;Right;10 reps;Supine Long Arc Quad: AAROM;Right;10 reps;Seated Other Exercises Other Exercises: PNF diagonals during functional tasks to incorporate trunkad weight shifts in sitting position Other Exercises: rubbing lotion of BLE while weight bearing through RUE to encoruage anterior/posterior weight shifts and midline postural control   Shoulder Instructions      Home Living                                          Prior Functioning/Environment                   OT Problem List:        OT Treatment/Interventions:      OT Goals(Current goals can be found in the care plan section) Acute Rehab OT Goals Patient Stated Goal: none stated this session OT Goal Formulation: With patient/family Time For Goal Achievement: 09/02/18 Potential to Achieve Goals: Good ADL Goals Pt Will Perform Grooming: with set-up;sitting Pt Will Perform Upper Body Bathing: with min assist;sitting Pt Will Perform Upper Body Dressing: with min assist;sitting Pt Will Perform Lower Body Dressing: with min assist;sit to/from stand Pt Will Transfer to Toilet: with min assist;stand pivot transfer;bedside commode Pt Will Perform Toileting - Clothing Manipulation and hygiene: with min assist;sit to/from stand Pt/caregiver will Perform Home Exercise Program: Increased ROM;Increased strength;Right Upper extremity;With minimal assist Additional ADL Goal #1: Pt will follow 5/5 one step commands during functional task completion.  OT Frequency: Min 2X/week   Barriers to D/C:             Co-evaluation PT/OT/SLP Co-Evaluation/Treatment: Yes Reason for Co-Treatment: Complexity of the patient's impairments (multi-system involvement);For patient/therapist safety;To address functional/ADL transfers   OT goals addressed during session: ADL's and self-care;Strengthening/ROM      AM-PAC OT "6 Clicks" Daily Activity     Outcome Measure Help from another person eating meals?: A Little Help from another person taking care of personal grooming?: A Little Help from another person toileting, which includes using toliet, bedpan, or urinal?: Total Help from another person bathing (including washing, rinsing, drying)?: A Lot Help from another person to put on and taking off regular upper body clothing?: A Lot Help from another person to put on and taking off regular lower body clothing?: A Lot 6 Click Score: 13   End of Session Equipment Utilized During Treatment: Gait belt Nurse Communication: Mobility status;Need for lift equipment(Stedy)  Activity Tolerance: Patient tolerated treatment well Patient left: in chair;with call bell/phone within reach;with chair alarm set  OT Visit Diagnosis: Muscle weakness (generalized) (M62.81);Hemiplegia and hemiparesis Hemiplegia - Right/Left: Right Hemiplegia - dominant/non-dominant: Dominant Hemiplegia - caused by: Cerebral infarction                Time:  1610-9604 OT Time Calculation (min): 35 min Charges:  OT General Charges $OT Visit: 1 Visit OT Treatments $Self Care/Home Management : 8-22 mins  Luisa Dago, OT/L   Acute OT Clinical Specialist Acute Rehabilitation Services Pager [phone] Office [phone]   [facility] 08/25/2018, 12:44 PM

## 2018-08-25 NOTE — Social Work (Addendum)
3:50pm- Spoke with E[facility] No[facility]essa at [facility]. Pt able to come tomorrow pending medical stability. She will go to room 115da. Report to be called to [phone] for 1st floor. [employer] ready to pick up, they will need to be called with discharge time at [phone].   3:37pm- Spoke with pt sister, pt sister states that she has called [employer] for pt and they need CSW to call and speak with them about transportation for pt. Called [employer] [employer] [phone]. They are aware that pt is medically cleared and would not be leaving AMA. They have trucks ready should pt be able to discharge tomorrow.   11:20am- received call from [facility] at [phone]. Weston SettleVanessa Davis, admissions liaison is working on approval for pt. She has spoken with sister and business office at SNF is reaching out to pt sister regarding payment.   9:55am- CSW followed up with [facility] at [phone], message left for admissions liaison, await return call.   Also [facility] [phone] admissions, message left for return call.   Pt family declining in state placement per CSW note 11/29. Will need to private pay for SNF placement as pt does not have insurance coverage for CIR or SNF.   Octavio GravesIsabel Duwan Adrian, MSW, [facility] [place] Clinical Social Work [phone]

## 2018-08-25 NOTE — Progress Notes (Signed)
IP rehab admissions - I met with patient and her sister from [place].  Patient has limited BCBS coverage that does not cover CIR or SNF per patient's sister.  Sister is considering in home care with caregivers rather than paying privately for SNF.  Sister does not want CIR due to costs.  I will update case Freight forwarder and Education officer, museum.  Call me for questions.  [phone]

## 2018-08-26 LAB — GLUCOSE, CAPILLARY
Glucose-Capillary: 128 mg/dL — ABNORMAL HIGH (ref 70–99)
Glucose-Capillary: 178 mg/dL — ABNORMAL HIGH (ref 70–99)

## 2018-08-26 MED ORDER — AMLODIPINE BESYLATE 10 MG PO TABS: 10.0000 mg | ORAL_TABLET | Freq: Every day | ORAL | Status: AC

## 2018-08-26 MED ORDER — AMLODIPINE BESYLATE 10 MG PO TABS
10.0000 mg | ORAL_TABLET | Freq: Every day | ORAL | Status: DC
  Administered 2018-08-26: 10 mg via ORAL
  Filled 2018-08-26: qty 1

## 2018-08-26 NOTE — Progress Notes (Signed)
Seen and briefly examined-appears unchanged from yesterday.  Stable for discharge today-please see discharge summary done yesterday.  Medications listed in discharge summary are up-to-date.

## 2018-08-26 NOTE — Plan of Care (Signed)
Adequate for discharge.

## 2018-08-26 NOTE — Progress Notes (Signed)
  Speech Language Pathology Treatment: Dysphagia;Cognitive-Linquistic  Patient Details Name: Catherine Boyd MRN: [id number] DOB: 12/02/1955 Today's Date: 08/26/2018 Time: [id number] SLP Time Calculation (min) (ACUTE ONLY): 12 min  Assessment / Plan / Recommendation Clinical Impression  Pt was alert and cooperative throughout ST session focused on dysphagia and dysarthria intervention. Although she required min verbal cues to use slow rate and take small bites/sips of regular and thin POs, no overt s/s aspiration or oral phase impairments were observed. Her current presentation appears improved compared to initial dysphagia evaluation note (no anterior loss observed today), and pt denies any difficulty or coughing during meals.   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 Using demonstration and teach back method, SLP educated pt regarding clear speech strategies including slow rate, increased vocal intensity, and overexaggeration of articulators. Given mod-max verbal and visual cues, pt implemented strategies to achieve approximately 75% accuracy at the word and phrase level, however intelligibility at the sentence and conversation level remains reduced to approximately 40-50%. Pt recalled 2 out of 3 clear speech strategies upon end of session; sign posted for greater recall and consistent practice implementating strategies. ST will continue to follow pt acutely for speech and functional cognitive treatment, however no further dysphagia intervention is indicated.    HPI HPI:  62 y/o female with PMH significant for HTN, DM, presenting to ED after having been found down at home, having been down all weekend.  MRI showed Acute/early subacute infart within the L BG and corona radiate plus additional punctate focus in left anterior insula.        SLP Plan  Continue with current plan of care       Recommendations  Diet  recommendations: Regular;Thin liquid Liquids provided via: Cup Medication Administration: Whole meds with puree Supervision: Patient able to self feed Compensations: Minimize environmental distractions;Slow rate;Small sips/bites Postural Changes and/or Swallow Maneuvers: Seated upright 90 degrees                Oral Care Recommendations: Oral care BID Follow up Recommendations: Inpatient Rehab SLP Visit Diagnosis: Dysphagia, unspecified (R13.10) Plan: Continue with current plan of care       Catherine Boyd, Student SLP                 Catherine Boyd 08/26/2018, 1:57 PM

## 2018-08-26 NOTE — Clinical Social Work Placement (Addendum)
Nurse to call report to [phone], Room 115D. Ask for first floor nurses station.   Transport set for 1:30 PM.    CLINICAL SOCIAL WORK PLACEMENT  NOTE  Date:  08/26/2018  Patient Details  Name: Catherine Boyd MRN: [id number] Date of Birth: 01/29/1956  Clinical Social Work is seeking post-discharge placement for this patient at the Skilled  Nursing Facility level of care (*CSW will initial, date and re-position this form in  chart as items are completed):  Yes   Patient/family provided with [place] Clinical Social Work Department's list of facilities offering this level of care within the geographic area requested by the patient (or if unable, by the patient's family).  Yes   Patient/family informed of their freedom to choose among providers that offer the needed level of care, that participate in Medicare, Medicaid or managed care program needed by the patient, have an available bed and are willing to accept the patient.  Yes   Patient/family informed of [facility] ownership interest [facility] and [facility], as well as of the fact that they are under no obligation to receive care at these facilities.  PASRR submitted to EDS on       PASRR number received on       Existing PASRR number confirmed on       FL2 transmitted to all facilities in geographic area requested by pt/family on       FL2 transmitted to all facilities within larger geographic area on       Patient informed that his/her managed care company has contracts with or will negotiate with certain facilities, including the following:        Yes   Patient/family informed of bed offers received.  Patient [facility] bed [facility] [place]     Physician recommends and patient chooses bed at      Patient to be transferred to [facility] on 08/26/18.  [facility]     Patient family notified on 08/26/18 of  transfer.  Name of family member notified:  Fredricka     PHYSICIAN       Additional Comment:    _______________________________________________ Baldemar LenisElizabeth M Hitomi Slape, LCSW 08/26/2018, 11:01 AM

## 2018-08-26 NOTE — Progress Notes (Signed)
Physical Therapy Treatment Patient Details Name: Catherine Boyd MRN: [id number] DOB: 1955/12/22 Today's Date: 08/26/2018    History of Present Illness pt is a 62 y/o female with PMH significant for HTN, DM, presenting to ED after having been found down at home, having been down all weekend.  MRI showed Acute/early subacute infart within the L BG and corona radiate pluc additional puctate focus in left anterior insula.  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    PT Comments    Patient's tolerance to treatment today was good.  Patient was in bed with no visitors present upon PT arrival.  Patient had to perform rolling several times for therapist to provide pericare.  Patient was encouraged to use R UE throughout session.  Patient ambulated in room with 2 person hand held assist with 3rd person for chair follow.  Patient continues to require assistance with advancing R LE during ambulation.  I have discussed the patient's current level of function related to mobility with the patient.  She acknowledges understanding of this and does not feel the she would be able to have her care needs met at home.  She is interested in post-acute rehab in an inpatient setting.        Follow Up Recommendations  CIR;Supervision/Assistance - 24 hour     Equipment Recommendations  Other (comment)    Recommendations for Other Services Rehab consult     Precautions / Restrictions Precautions Precautions: Fall Restrictions Weight Bearing Restrictions: No    Mobility  Bed Mobility Overal bed mobility: Needs Assistance Bed Mobility: Rolling;Sidelying to Sit Rolling: Mod assist Sidelying to sit: Mod assist;+2 for physical assistance       General bed mobility comments: Pt had more difficulty rolling towards left side due to R arm/shoulder weakness and inability to reach for bed railing for assistance.  Patient completed rolling several times for therapist to provide pericare.  Patient  required VC to reach for bed rail and to use R UE during bed mobility.  Patient required mod assist to progress B LE off EOB.  Pt required mod A +2 to elevate trunk into sitting EOB.  Transfers Overall transfer level: Needs assistance Equipment used: 2 person hand held assist Transfers: Sit to/from Stand Sit to Stand: Mod assist;+2 physical assistance         General transfer comment: Pt required mod A +2 to boost into standing from EOB.  Patient required VC and tactile cueing for upright posture.  Pt required facilitation from therapist for hip extension.  Ambulation/Gait Ambulation/Gait assistance: Max assist;+2 physical assistance(3rd person for chair follow) Gait Distance (Feet): 10 Feet Assistive device: 2 person hand held assist Gait Pattern/deviations: Step-to pattern;Decreased step length - right;Decreased step length - left;Drifts right/left;Trunk flexed Gait velocity: extremely slow   General Gait Details: Pt required facilitation from therapist to advance R LE.  Towards end of trial, pt was able to initiate movement of R LE in an attempt to take a step but still required assistance for advancement.  VC and tactile cues provided for pt to look forward and extend hips.   Stairs             Wheelchair Mobility    Modified Rankin (Stroke Patients Only) Modified Rankin (Stroke Patients Only) Pre-Morbid Rankin Score: No symptoms Modified Rankin: Moderately severe disability     Balance Overall balance assessment: Needs assistance Sitting-balance support: Single extremity supported;No upper extremity supported Sitting balance-Leahy Scale: Poor Sitting balance - Comments: Pt  continues to lean posteriorly.  Repeated VC and tactile cues to increase anterior lean and to maintain midline position of trunk.  Pt utilized end bed railing with L hand to make postural corrections. Postural control: Posterior lean;Left lateral lean Standing balance support: Bilateral upper  extremity supported Standing balance-Leahy Scale: Poor Standing balance comment: Reliant on external assist for standing activities.                            Cognition Arousal/Alertness: Awake/alert Behavior During Therapy: WFL for tasks assessed/performed Overall Cognitive Status: Impaired/Different from baseline Area of Impairment: Attention;Following commands;Awareness;Problem solving;Safety/judgement                   Current Attention Level: Sustained   Following Commands: Follows one step commands consistently Safety/Judgement: Decreased awareness of safety;Decreased awareness of deficits Awareness: Emergent Problem Solving: Decreased initiation;Difficulty sequencing;Requires tactile cues;Slow processing;Requires verbal cues        Exercises      General Comments        Pertinent Vitals/Pain Pain Assessment: No/denies pain Faces Pain Scale: Hurts a little bit Pain Intervention(s): Monitored during session    Home Living                      Prior Function            PT Goals (current goals can now be found in the care plan section) Acute Rehab PT Goals Patient Stated Goal: none stated this session PT Goal Formulation: With patient Time For Goal Achievement: 09/02/18 Potential to Achieve Goals: Good Progress towards PT goals: Progressing toward goals    Frequency    Min 4X/week      PT Plan Current plan remains appropriate    Co-evaluation              AM-PAC PT "6 Clicks" Mobility   Outcome Measure  Help needed turning from your back to your side while in a flat bed without using bedrails?: A Lot Help needed moving from lying on your back to sitting on the side of a flat bed without using bedrails?: A Lot Help needed moving to and from a bed to a chair (including a wheelchair)?: A Lot Help needed standing up from a chair using your arms (e.g., wheelchair or bedside chair)?: A Lot Help needed to walk in hospital  room?: A Lot Help needed climbing 3-5 steps with a railing? : Total 6 Click Score: 11    End of Session Equipment Utilized During Treatment: Gait belt Activity Tolerance: Patient tolerated treatment well Patient left: in chair;with call bell/phone within reach;with chair alarm set Nurse Communication: Mobility status PT Visit Diagnosis: Unsteadiness on feet (R26.81);Hemiplegia and hemiparesis;Other abnormalities of gait and mobility (R26.89) Hemiplegia - Right/Left: Right Hemiplegia - dominant/non-dominant: Dominant Hemiplegia - caused by: Cerebral infarction     Time: 9826-4158 PT Time Calculation (min) (ACUTE ONLY): 42 min  Charges:  $Gait Training: 8-22 mins $Therapeutic Activity: 23-37 mins                     [address], SPTA    Pilar Westergaard 08/26/2018, 1:15 PM

## 2019-03-28 IMAGING — MR MR MRA HEAD W/O CM
9 of 12 series · 31 of 48 positions shown · non-contrast
Comparison: 08/18/2018 CT head.

CLINICAL DATA: 61 y/o  F; found down.  Stroke follow-up.

EXAM:
MRI HEAD WITHOUT CONTRAST
MRA HEAD WITHOUT CONTRAST
TECHNIQUE: Multiplanar, multiecho pulse sequences of the brain and surrounding
structures were obtained without intravenous contrast. Angiographic
images of the head were obtained using MRA technique without
contrast.

[Series 3: DWI · axial · 3.0mm · 1.09mm/px · z∈[-89,+76]mm · 8 of 112 slices shown (1 of 4)]
[im 1/112]
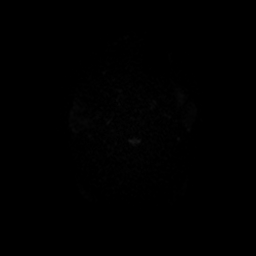
[im 16/112]
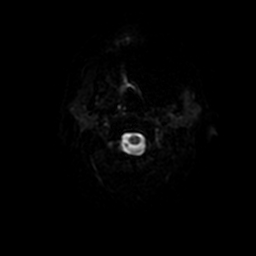
[im 32/112]
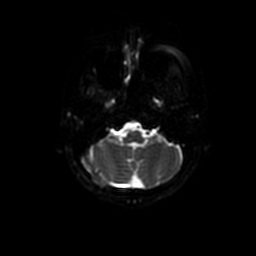
[im 48/112]
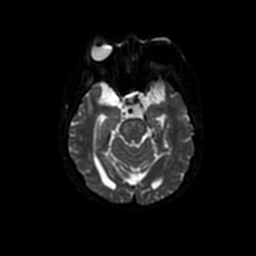
[im 64/112]
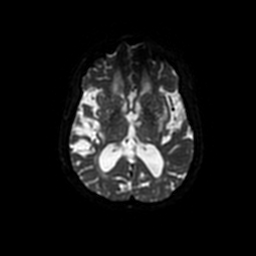
[im 80/112]
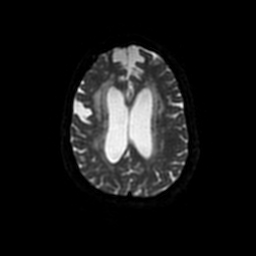
[im 96/112]
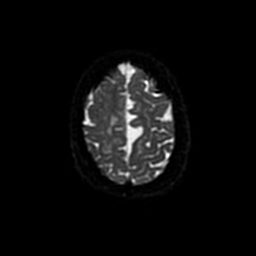
[im 112/112]
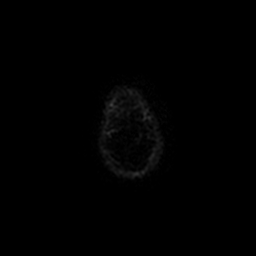

[Series 4: (id) mt fs · axial · 1.4mm · 0.43mm/px · z∈[-55,-13]mm · 4 of 136 slices shown]
[im 1/136]
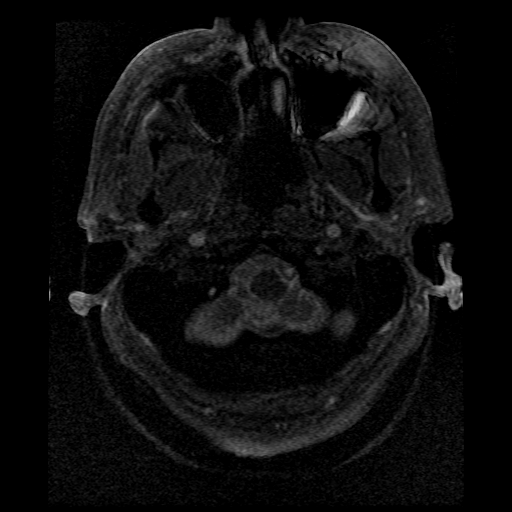
[im 16/136]
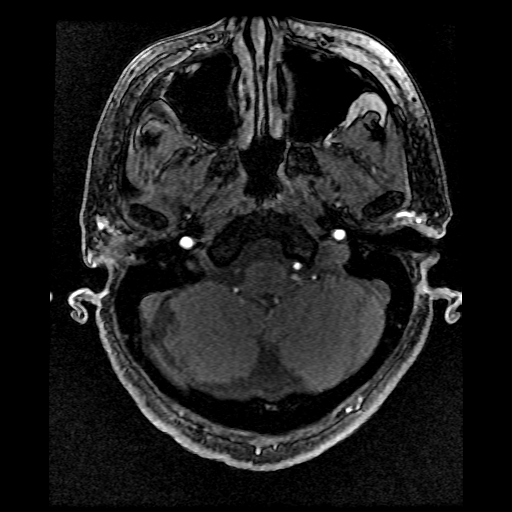
[im 46/136]
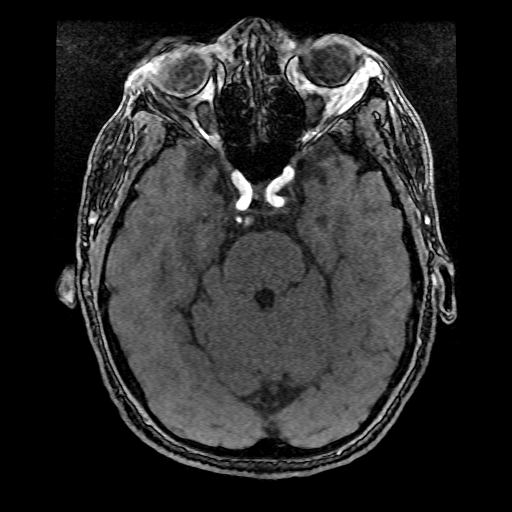
[im 61/136]
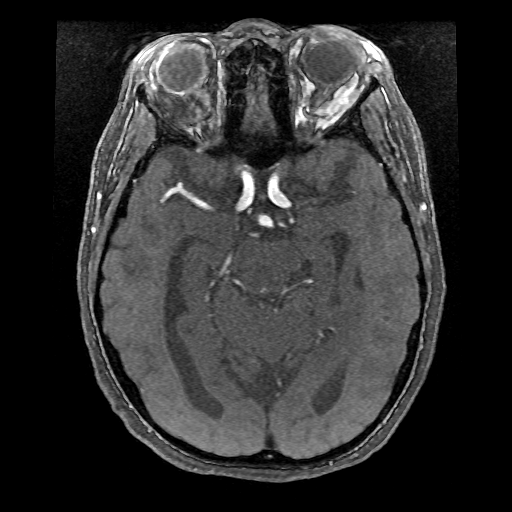

[Series 5: DWI · coronal · 5.0mm · 1.09mm/px · 5 of 66 slices shown (2 of 4)]
[im 1/66]
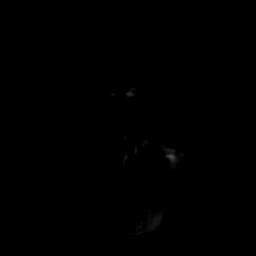
[im 17/66]
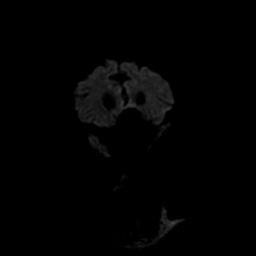
[im 33/66]
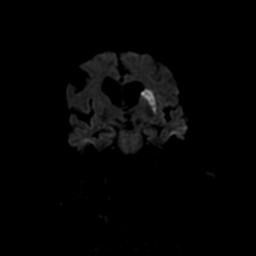
[im 49/66]
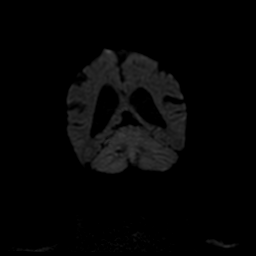
[im 66/66]
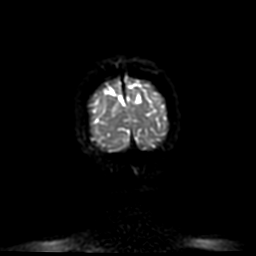

[Series 6: T1 · sagittal · 5.0mm · 0.47mm/px · 2 of 23 slices shown]
[im 1/23]
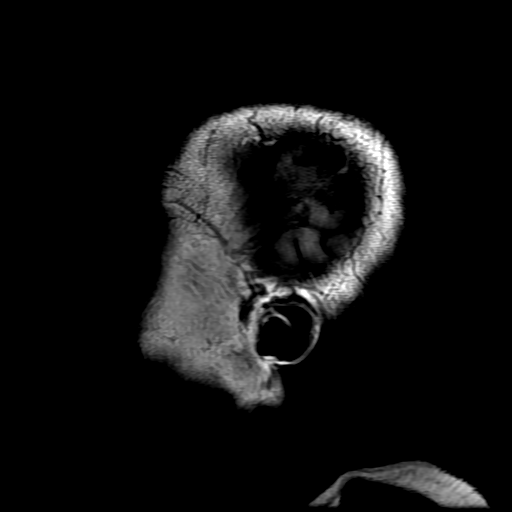
[im 23/23]
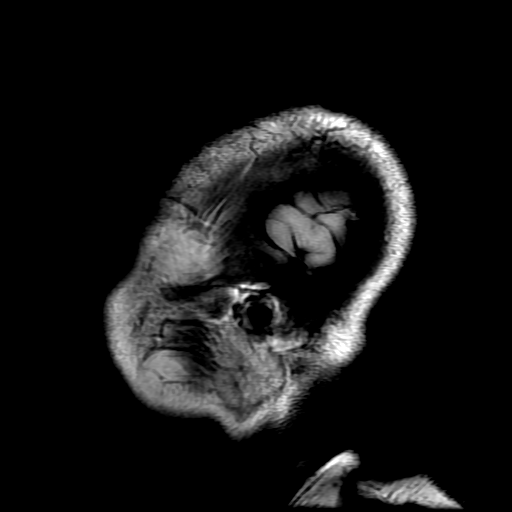

[Series 7: T2 · axial · 5.0mm · 0.43mm/px · z∈[-94,+67]mm · 2 of 28 slices shown (1 of 2)]
[im 1/28]
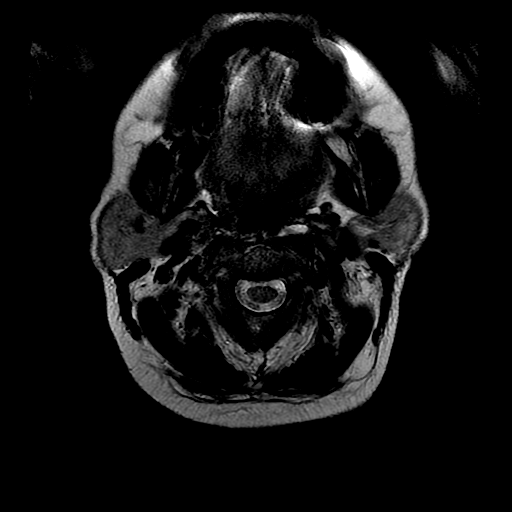
[im 28/28]
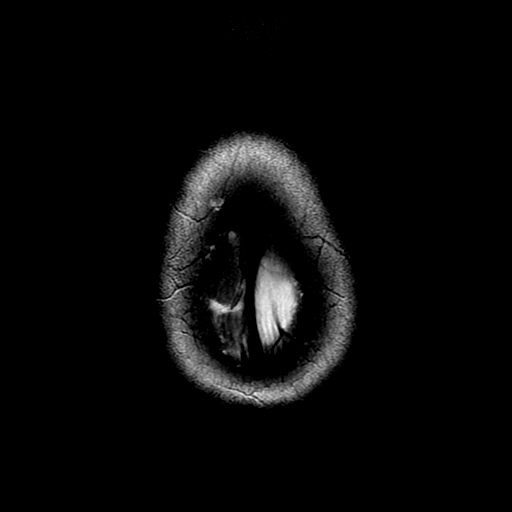

[Series 9: FLAIR · axial · 3.0mm · 0.43mm/px · z∈[-88,+67]mm · 2 of 27 slices shown]
[im 1/27]
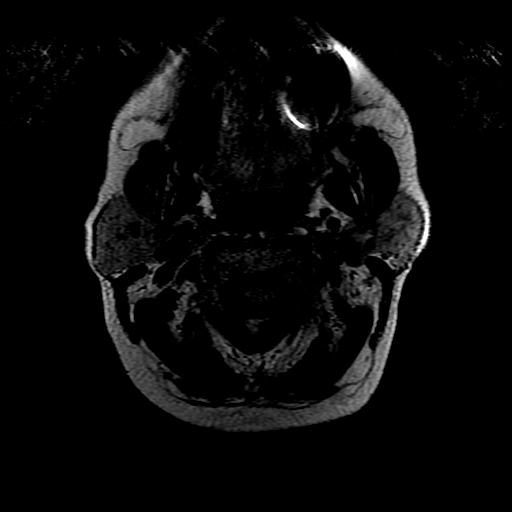
[im 27/27]
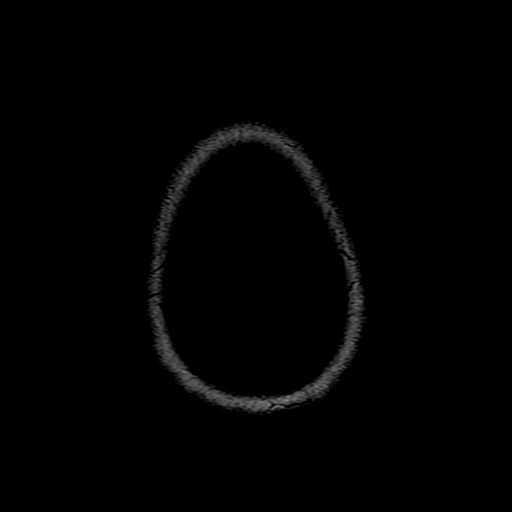

[Series 11: T2 · coronal · 5.0mm · 0.39mm/px · 2 of 24 slices shown (2 of 2)]
[im 1/24]
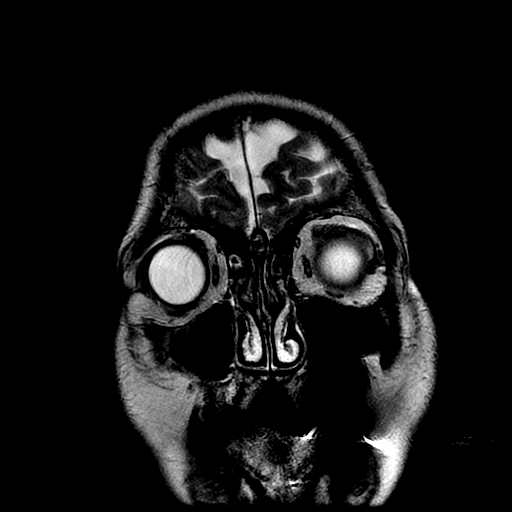
[im 24/24]
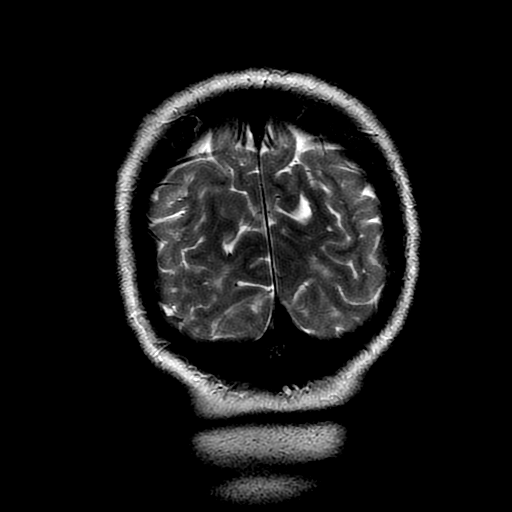

[Series 300: DWI · axial · 3.0mm · 1.09mm/px · z∈[-89,+76]mm · 4 of 56 slices shown (3 of 4)]
[im 1/56]
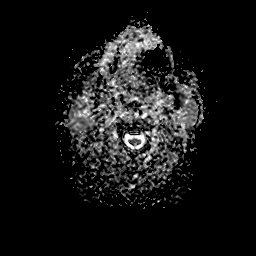
[im 19/56]
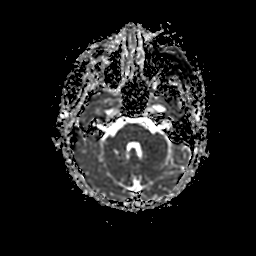
[im 37/56]
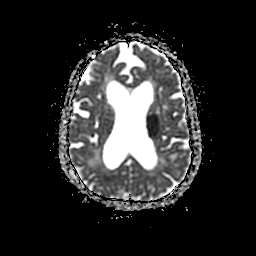
[im 56/56]
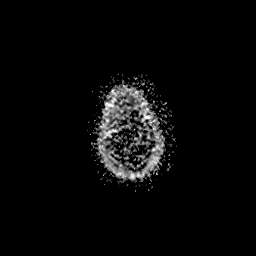

[Series 500: DWI · coronal · 5.0mm · 1.09mm/px · 2 of 33 slices shown (4 of 4)]
[im 1/33]
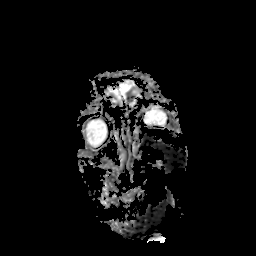
[im 33/33]
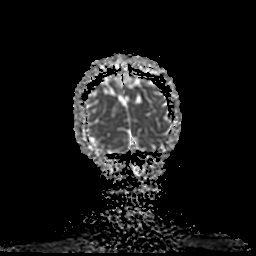

[31 of 48 positions shown; findings below may reference images not displayed]

FINDINGS: MRI HEAD FINDINGS

Brain: Focus of reduced diffusion of left putamen, corona radiata,
and caudate body measuring 2.5 x 1.2 x 2.5 cm (volume = 3.9 cm^3)
(AP x ML x CC series 3, image 38 and series 5, image 17) compatible
with acute/early subacute infarction. Additional punctate focus of
reduced diffusion within the right anterior insula. 12 mm focus of
intermediate diffusion within the right brachium pontis compatible
with subacute infarct (series 3, image 19).

Patchy comp nonspecific T2 FLAIR hyperintensities in subcortical and
periventricular white matter as well as the pons are compatible with
advanced chronic microvascular ischemic changes for age. Moderate to
severe volume loss of the brain. Left frontoparietal paramedian
prominent extra-axial space following CSF signal spanning 3.5 x
x 2.7 cm (AP x ML x CC series 7, image 27 and series 6, image 12),
likely arachnoid cyst. Punctate foci of susceptibility hypointensity
compatible with hemosiderin deposition of chronic microhemorrhage
are present scattered throughout the brain in a predominantly
central distribution.

Vascular: As below.

Skull and upper cervical spine: Normal marrow signal.

Sinuses/Orbits: Negative.

Other: None.

MRA HEAD FINDINGS

Internal carotid arteries:  Patent.

Anterior cerebral arteries:  Patent.

Middle cerebral arteries: Patent.

Anterior communicating artery: Not identified, likely hypoplastic or
absent.

Posterior communicating arteries:  Patent.  Fetal left PCA.

Posterior cerebral arteries:  Patent.

Basilar artery:  Patent.

Vertebral arteries: Patent. Left dominant. Right vertebral artery
terminates in right PICA. Persistent right trigeminal artery variant
anatomy.

No evidence of high-grade stenosis, large vessel occlusion, or
aneurysm.
IMPRESSION: MRI head:

1. Acute/early subacute infarction within the left basal ganglia and
corona radiata measuring up to 2.5 cm, 3.9 cc. No associated
hemorrhage or mass effect. Additional punctate focus in left
anterior insula.
2. 12 mm subacute infarction within right brachium pontis.
3. Advanced chronic microvascular ischemic changes and moderate
volume loss of the brain.
4. Multiple foci of chronic microhemorrhage with predominantly
central distribution favoring sequelae of chronic hypertension.
5. Left paramedian frontoparietal region arachnoid cyst.

MRA head:

1. No large vessel occlusion, aneurysm, or significant stenosis is
identified.
2. Incidental persistent right trigeminal artery noted.

These results will be called to the ordering clinician or
representative by the Radiologist Assistant, and communication
documented in the PACS or zVision Dashboard.
